# Patient Record
Sex: Female | Born: 1985 | Race: White | Hispanic: No | Marital: Married | Smoking: Never smoker
Health system: Southern US, Community
[De-identification: ages and names within clinical notes are randomized; demographics above are authoritative.]

## PROBLEM LIST (undated history)

## (undated) ENCOUNTER — Inpatient Hospital Stay (HOSPITAL_COMMUNITY): Payer: Self-pay

## (undated) DIAGNOSIS — F319 Bipolar disorder, unspecified: Secondary | ICD-10-CM

## (undated) DIAGNOSIS — R87629 Unspecified abnormal cytological findings in specimens from vagina: Secondary | ICD-10-CM

## (undated) DIAGNOSIS — Z9109 Other allergy status, other than to drugs and biological substances: Secondary | ICD-10-CM

## (undated) DIAGNOSIS — K529 Noninfective gastroenteritis and colitis, unspecified: Secondary | ICD-10-CM

## (undated) DIAGNOSIS — J45909 Unspecified asthma, uncomplicated: Secondary | ICD-10-CM

## (undated) DIAGNOSIS — D696 Thrombocytopenia, unspecified: Secondary | ICD-10-CM

## (undated) DIAGNOSIS — O99119 Other diseases of the blood and blood-forming organs and certain disorders involving the immune mechanism complicating pregnancy, unspecified trimester: Secondary | ICD-10-CM

## (undated) DIAGNOSIS — Z8619 Personal history of other infectious and parasitic diseases: Secondary | ICD-10-CM

## (undated) HISTORY — DX: Unspecified abnormal cytological findings in specimens from vagina: R87.629

## (undated) HISTORY — PX: TONSILLECTOMY: SUR1361

## (undated) HISTORY — PX: MYRINGOTOMY WITH TUBE PLACEMENT: SHX5663

## (undated) HISTORY — DX: Personal history of other infectious and parasitic diseases: Z86.19

## (undated) HISTORY — PX: GYNECOLOGIC CRYOSURGERY: SHX857

## (undated) HISTORY — DX: Other diseases of the blood and blood-forming organs and certain disorders involving the immune mechanism complicating pregnancy, unspecified trimester: O99.119

## (undated) HISTORY — PX: WISDOM TOOTH EXTRACTION: SHX21

## (undated) HISTORY — DX: Other diseases of the blood and blood-forming organs and certain disorders involving the immune mechanism complicating pregnancy, unspecified trimester: D69.6

## (undated) HISTORY — PX: SINUS EXPLORATION: SHX5214

---

## 2014-10-30 LAB — OB RESULTS CONSOLE ABO/RH: RH TYPE: NEGATIVE

## 2014-10-30 LAB — OB RESULTS CONSOLE RUBELLA ANTIBODY, IGM: Rubella: IMMUNE

## 2014-10-30 LAB — OB RESULTS CONSOLE GC/CHLAMYDIA
CHLAMYDIA, DNA PROBE: NEGATIVE
Gonorrhea: NEGATIVE

## 2014-10-30 LAB — OB RESULTS CONSOLE HIV ANTIBODY (ROUTINE TESTING): HIV: NONREACTIVE

## 2014-10-30 LAB — OB RESULTS CONSOLE ANTIBODY SCREEN: Antibody Screen: NEGATIVE

## 2014-10-30 LAB — OB RESULTS CONSOLE RPR: RPR: NONREACTIVE

## 2014-10-30 LAB — OB RESULTS CONSOLE HEPATITIS B SURFACE ANTIGEN: Hepatitis B Surface Ag: NEGATIVE

## 2015-01-12 ENCOUNTER — Encounter (HOSPITAL_COMMUNITY): Payer: Self-pay | Admitting: *Deleted

## 2015-01-12 ENCOUNTER — Inpatient Hospital Stay (HOSPITAL_COMMUNITY)
Admission: AD | Admit: 2015-01-12 | Discharge: 2015-01-13 | Disposition: A | Discharge status: Home / Self Care | Payer: BLUE CROSS/BLUE SHIELD | Source: Ambulatory Visit | Attending: Obstetrics and Gynecology | Admitting: Obstetrics and Gynecology

## 2015-01-12 DIAGNOSIS — K529 Noninfective gastroenteritis and colitis, unspecified: Secondary | ICD-10-CM | POA: Diagnosis present

## 2015-01-12 DIAGNOSIS — Z3A21 21 weeks gestation of pregnancy: Secondary | ICD-10-CM | POA: Insufficient documentation

## 2015-01-12 DIAGNOSIS — O210 Mild hyperemesis gravidarum: Secondary | ICD-10-CM | POA: Insufficient documentation

## 2015-01-12 DIAGNOSIS — F319 Bipolar disorder, unspecified: Secondary | ICD-10-CM | POA: Insufficient documentation

## 2015-01-12 DIAGNOSIS — R197 Diarrhea, unspecified: Secondary | ICD-10-CM | POA: Insufficient documentation

## 2015-01-12 DIAGNOSIS — O99112 Other diseases of the blood and blood-forming organs and certain disorders involving the immune mechanism complicating pregnancy, second trimester: Secondary | ICD-10-CM | POA: Insufficient documentation

## 2015-01-12 DIAGNOSIS — D696 Thrombocytopenia, unspecified: Secondary | ICD-10-CM | POA: Insufficient documentation

## 2015-01-12 DIAGNOSIS — J45909 Unspecified asthma, uncomplicated: Secondary | ICD-10-CM | POA: Insufficient documentation

## 2015-01-12 HISTORY — DX: Unspecified asthma, uncomplicated: J45.909

## 2015-01-12 HISTORY — DX: Noninfective gastroenteritis and colitis, unspecified: K52.9

## 2015-01-12 HISTORY — DX: Bipolar disorder, unspecified: F31.9

## 2015-01-12 HISTORY — DX: Other allergy status, other than to drugs and biological substances: Z91.09

## 2015-01-12 NOTE — MAU Note (Signed)
N/v/d since this am. Husband sick with same symptoms

## 2015-01-13 ENCOUNTER — Encounter (HOSPITAL_COMMUNITY): Payer: Self-pay | Admitting: *Deleted

## 2015-01-13 DIAGNOSIS — O99112 Other diseases of the blood and blood-forming organs and certain disorders involving the immune mechanism complicating pregnancy, second trimester: Secondary | ICD-10-CM | POA: Diagnosis not present

## 2015-01-13 DIAGNOSIS — O210 Mild hyperemesis gravidarum: Secondary | ICD-10-CM | POA: Diagnosis not present

## 2015-01-13 DIAGNOSIS — K529 Noninfective gastroenteritis and colitis, unspecified: Secondary | ICD-10-CM

## 2015-01-13 DIAGNOSIS — Z3A21 21 weeks gestation of pregnancy: Secondary | ICD-10-CM | POA: Diagnosis not present

## 2015-01-13 DIAGNOSIS — R197 Diarrhea, unspecified: Secondary | ICD-10-CM | POA: Diagnosis not present

## 2015-01-13 DIAGNOSIS — F319 Bipolar disorder, unspecified: Secondary | ICD-10-CM | POA: Diagnosis not present

## 2015-01-13 DIAGNOSIS — D696 Thrombocytopenia, unspecified: Secondary | ICD-10-CM | POA: Diagnosis not present

## 2015-01-13 DIAGNOSIS — J45909 Unspecified asthma, uncomplicated: Secondary | ICD-10-CM | POA: Diagnosis not present

## 2015-01-13 HISTORY — DX: Noninfective gastroenteritis and colitis, unspecified: K52.9

## 2015-01-13 LAB — URINALYSIS, ROUTINE W REFLEX MICROSCOPIC
Glucose, UA: NEGATIVE mg/dL
HGB URINE DIPSTICK: NEGATIVE
Ketones, ur: 40 mg/dL — AB
Leukocytes, UA: NEGATIVE
Nitrite: NEGATIVE
Protein, ur: NEGATIVE mg/dL
UROBILINOGEN UA: 0.2 mg/dL (ref 0.0–1.0)
pH: 6 (ref 5.0–8.0)

## 2015-01-13 LAB — URINALYSIS, DIPSTICK ONLY
Bilirubin Urine: NEGATIVE
GLUCOSE, UA: 250 mg/dL — AB
Hgb urine dipstick: NEGATIVE
Ketones, ur: NEGATIVE mg/dL
Leukocytes, UA: NEGATIVE
NITRITE: NEGATIVE
Protein, ur: NEGATIVE mg/dL
UROBILINOGEN UA: 0.2 mg/dL (ref 0.0–1.0)
pH: 5.5 (ref 5.0–8.0)

## 2015-01-13 LAB — CBC
HCT: 37.1 % (ref 36.0–46.0)
HEMOGLOBIN: 12.8 g/dL (ref 12.0–15.0)
MCH: 30 pg (ref 26.0–34.0)
MCHC: 34.5 g/dL (ref 30.0–36.0)
MCV: 86.9 fL (ref 78.0–100.0)
Platelets: 137 10*3/uL — ABNORMAL LOW (ref 150–400)
RBC: 4.27 MIL/uL (ref 3.87–5.11)
RDW: 13.3 % (ref 11.5–15.5)
WBC: 14.5 10*3/uL — AB (ref 4.0–10.5)

## 2015-01-13 LAB — COMPREHENSIVE METABOLIC PANEL
ALK PHOS: 82 U/L (ref 38–126)
ALT: 18 U/L (ref 14–54)
AST: 19 U/L (ref 15–41)
Albumin: 3.4 g/dL — ABNORMAL LOW (ref 3.5–5.0)
Anion gap: 9 (ref 5–15)
BILIRUBIN TOTAL: 0.3 mg/dL (ref 0.3–1.2)
BUN: 8 mg/dL (ref 6–20)
CALCIUM: 8.5 mg/dL — AB (ref 8.9–10.3)
CHLORIDE: 102 mmol/L (ref 101–111)
CO2: 24 mmol/L (ref 22–32)
Creatinine, Ser: 0.62 mg/dL (ref 0.44–1.00)
GFR calc non Af Amer: >60 mL/min (ref >60)
GLUCOSE: 114 mg/dL — AB (ref 65–99)
Potassium: 3.6 mmol/L (ref 3.5–5.1)
Sodium: 135 mmol/L (ref 135–145)
Total Protein: 6.3 g/dL — ABNORMAL LOW (ref 6.5–8.1)

## 2015-01-13 LAB — AMYLASE: AMYLASE: 55 U/L (ref 28–100)

## 2015-01-13 LAB — LIPASE, BLOOD: LIPASE: 19 U/L — AB (ref 22–51)

## 2015-01-13 MED ORDER — PROMETHAZINE HCL 12.5 MG PO TABS: 12.5000 mg | ORAL_TABLET | Freq: Four times a day (QID) | ORAL | Status: DC | PRN

## 2015-01-13 MED ORDER — M.V.I. ADULT IV INJ
Freq: Once | INTRAVENOUS | Status: DC
  Filled 2015-01-13: qty 1000

## 2015-01-13 MED ORDER — M.V.I. ADULT IV INJ: Freq: Once | INTRAVENOUS | Status: DC

## 2015-01-13 MED ORDER — PROMETHAZINE HCL 25 MG/ML IJ SOLN
INTRAVENOUS | Status: AC
  Administered 2015-01-13: 01:00:00 via INTRAVENOUS
  Filled 2015-01-13: qty 1000

## 2015-01-13 MED ORDER — M.V.I. ADULT IV INJ
Freq: Once | INTRAVENOUS | Status: AC
  Administered 2015-01-13: 02:00:00 via INTRAVENOUS
  Filled 2015-01-13: qty 1000

## 2015-01-13 NOTE — MAU Provider Note (Signed)
History     CSN: [id number]  Arrival date and time: 01/12/15 2343  Provider notified: 0042 Orders placed in EPIC: 0053 Provider on unit: 0240 Provider at bedside: 0245     Chief Complaint  Patient presents with  . Emesis  . Diarrhea   HPI  Ms. Sarah Ramirez is 29 yo G1P0 female at 21.[redacted] wks gestation presenting with complaints of N/V/D all day.  She reports that she has been unable to keep any food or fluids down all day.  She was prescribed Zofran 8 mg ODT, took one dose and had no relief; still having projectile vomiting and diarrhea.  Her husband is sick at home with the same symptoms. She reports (+) flutters. Denies VB or LOF.  Her OB hx is significant for: bipolar disorder on no meds (stopped with (+) UPT). Dr. Ernestina PennaFogleman is her primary OB provider at [facility].   Past Medical History  Diagnosis Date  . Asthma   . Environmental allergies   . Bipolar disorder   . Gastroenteritis 01/13/2015    Past Surgical History  Procedure Laterality Date  . Sinus exploration    . Tonsillectomy    . Myringotomy with tube placement      History reviewed. No pertinent family history.  History  Substance Use Topics  . Smoking status: Never Smoker   . Smokeless tobacco: Not on file  . Alcohol Use: No    Allergies:  Allergies  Allergen Reactions  . Peanut-Containing Drug Products   . Shellfish Allergy   . Tomato   . Sulfa Antibiotics Nausea Only  . Zoloft [Sertraline Hcl] Nausea Only    No prescriptions prior to admission    Review of Systems  Constitutional: Positive for chills and malaise/fatigue.  HENT: Negative.   Eyes: Negative.   Cardiovascular: Negative.   Gastrointestinal: Positive for nausea, vomiting and diarrhea.       Vomiting x 2 episodes since arriving to hospital  Genitourinary: Negative.   Musculoskeletal: Negative.   Skin: Negative.   Neurological: Positive for dizziness.       Some leg twitching since receiving Phenergan  Endo/Heme/Allergies: Negative.    Psychiatric/Behavioral: Negative.    Results for orders placed or performed during the hospital encounter of 01/12/15 (from the past 24 hour(s))  Urinalysis, Routine w reflex microscopic     Status: Abnormal   Collection Time: 01/13/15 12:55 AM  Result Value Ref Range   Color, Urine YELLOW YELLOW   APPearance CLEAR CLEAR   Specific Gravity, Urine >1.030 (H) 1.005 - 1.030   pH 6.0 5.0 - 8.0   Glucose, UA NEGATIVE NEGATIVE mg/dL   Hgb urine dipstick NEGATIVE NEGATIVE   Bilirubin Urine SMALL (A) NEGATIVE   Ketones, ur 40 (A) NEGATIVE mg/dL   Protein, ur NEGATIVE NEGATIVE mg/dL   Urobilinogen, UA 0.2 0.0 - 1.0 mg/dL   Nitrite NEGATIVE NEGATIVE   Leukocytes, UA NEGATIVE NEGATIVE  CBC     Status: Abnormal   Collection Time: 01/13/15  1:00 AM  Result Value Ref Range   WBC 14.5 (H) 4.0 - 10.5 K/uL   RBC 4.27 3.87 - 5.11 MIL/uL   Hemoglobin 12.8 12.0 - 15.0 g/dL   HCT 40.937.1 81.136.0 - 91.446.0 %   MCV 86.9 78.0 - 100.0 fL   MCH 30.0 26.0 - 34.0 pg   MCHC 34.5 30.0 - 36.0 g/dL   RDW 78.213.3 95.611.5 - 21.315.5 %   Platelets 137 (L) 150 - 400 K/uL  Comprehensive metabolic panel  Status: Abnormal   Collection Time: 01/13/15  1:00 AM  Result Value Ref Range   Sodium 135 135 - 145 mmol/L   Potassium 3.6 3.5 - 5.1 mmol/L   Chloride 102 101 - 111 mmol/L   CO2 24 22 - 32 mmol/L   Glucose, Bld 114 (H) 65 - 99 mg/dL   BUN 8 6 - 20 mg/dL   Creatinine, Ser 5.28 0.44 - 1.00 mg/dL   Calcium 8.5 (L) 8.9 - 10.3 mg/dL   Total Protein 6.3 (L) 6.5 - 8.1 g/dL   Albumin 3.4 (L) 3.5 - 5.0 g/dL   AST 19 15 - 41 U/L   ALT 18 14 - 54 U/L   Alkaline Phosphatase 82 38 - 126 U/L   Total Bilirubin 0.3 0.3 - 1.2 mg/dL   GFR calc non Af Amer >60 >60 mL/min   GFR calc Af Amer >60 >60 mL/min   Anion gap 9 5 - 15  Amylase     Status: None   Collection Time: 01/13/15  1:00 AM  Result Value Ref Range   Amylase 55 28 - 100 U/L  Lipase, blood     Status: Abnormal   Collection Time: 01/13/15  1:00 AM  Result Value Ref  Range   Lipase 19 (L) 22 - 51 U/L  Urinalysis, dipstick only     Status: Abnormal   Collection Time: 01/13/15  3:30 AM  Result Value Ref Range   Specific Gravity, Urine <1.005 (L) 1.005 - 1.030   pH 5.5 5.0 - 8.0   Glucose, UA 250 (A) NEGATIVE mg/dL   Hgb urine dipstick NEGATIVE NEGATIVE   Bilirubin Urine NEGATIVE NEGATIVE   Ketones, ur NEGATIVE NEGATIVE mg/dL   Protein, ur NEGATIVE NEGATIVE mg/dL   Urobilinogen, UA 0.2 0.0 - 1.0 mg/dL   Nitrite NEGATIVE NEGATIVE   Leukocytes, UA NEGATIVE NEGATIVE   FHTs (doppler): 148 bpm  Physical Exam   Blood pressure 124/64, pulse 87, temperature 98 F (36.7 C), resp. rate 20, height  (1.702 m), weight 89.177 kg (196 lb 9.6 oz).  Physical Exam  Constitutional: She is oriented to person, place, and time. She appears well-developed and well-nourished.  HENT:  Head: Normocephalic and atraumatic.  Neck: Normal range of motion.  Cardiovascular: Normal rate, regular rhythm and normal heart sounds.   Respiratory: Effort normal and breath sounds normal.  GI: Soft.  Hypoactive bowel sounds  Genitourinary:  Pelvic exam deferred; (+) flutters  Musculoskeletal: Normal range of motion.  Neurological: She is alert and oriented to person, place, and time.  Skin: Skin is warm and dry.  Psychiatric: She has a normal mood and affect. Her behavior is normal. Judgment and thought content normal.    MAU Course  Procedures CCUA CBC CMP Amylase  Lipase LR with Phenergan 25 mg IV bolus D5LR with MVI at 500 ml/hr Urine dipstick only prior to end of 2nd liter of fluid Assessment and Plan  29 yo G1P0 at 21.[redacted] wks gestation Gastroenteritis Gestational Thrombocytopenia  Discharge Home Rx for Phenergan 12.5 mg po tablet every 4 hours prn N/V Alternate Zofran 8 mg ODT every 8 hours prn N/V Clear liquids as tolerated advancing to B.R.A.T. Diet today Keep scheduled appointment in 3 wks with Dr. Ernestina Penna Call the office for any problems, questions  or concerns   Kenard Gower MSN, CNM 01/13/2015, 3:00 AM

## 2015-01-13 NOTE — Discharge Instructions (Signed)
Viral Gastroenteritis Viral gastroenteritis is also known as stomach flu. This condition affects the stomach and intestinal tract. It can cause sudden diarrhea and vomiting. The illness typically lasts 3 to 8 days. Most people develop an immune response that eventually gets rid of the virus. While this natural response develops, the virus can make you quite ill. CAUSES  Many different viruses can cause gastroenteritis, such as rotavirus or noroviruses. You can catch one of these viruses by consuming contaminated food or water. You may also catch a virus by sharing utensils or other personal items with an infected person or by touching a contaminated surface. SYMPTOMS  The most common symptoms are diarrhea and vomiting. These problems can cause a severe loss of body fluids (dehydration) and a body salt (electrolyte) imbalance. Other symptoms may include:  Fever.  Headache.  Fatigue.  Abdominal pain. DIAGNOSIS  Your caregiver can usually diagnose viral gastroenteritis based on your symptoms and a physical exam. A stool sample may also be taken to test for the presence of viruses or other infections. TREATMENT  This illness typically goes away on its own. Treatments are aimed at rehydration. The most serious cases of viral gastroenteritis involve vomiting so severely that you are not able to keep fluids down. In these cases, fluids must be given through an intravenous line (IV). HOME CARE INSTRUCTIONS   Drink enough fluids to keep your urine clear or pale yellow. Drink small amounts of fluids frequently and increase the amounts as tolerated.  Ask your caregiver for specific rehydration instructions.  Avoid:  Foods high in sugar.  Alcohol.  Carbonated drinks.  Tobacco.  Juice.  Caffeine drinks.  Extremely hot or cold fluids.  Fatty, greasy foods.  Too much intake of anything at one time.  Dairy products until 24 to 48 hours after diarrhea stops.  You may consume  probiotics. Probiotics are active cultures of beneficial bacteria. They may lessen the amount and number of diarrheal stools in adults. Probiotics can be found in yogurt with active cultures and in supplements.  Wash your hands well to avoid spreading the virus.  Only take over-the-counter or prescription medicines for pain, discomfort, or fever as directed by your caregiver. Do not give aspirin to children. Antidiarrheal medicines are not recommended.  Ask your caregiver if you should continue to take your regular prescribed and over-the-counter medicines.  Keep all follow-up appointments as directed by your caregiver. SEEK IMMEDIATE MEDICAL CARE IF:   You are unable to keep fluids down.  You do not urinate at least once every 6 to 8 hours.  You develop shortness of breath.  You notice blood in your stool or vomit. This may look like coffee grounds.  You have abdominal pain that increases or is concentrated in one small area (localized).  You have persistent vomiting or diarrhea.  You have a fever.  The patient is a child younger than 3 months, and he or she has a fever.  The patient is a child older than 3 months, and he or she has a fever and persistent symptoms.  The patient is a child older than 3 months, and he or she has a fever and symptoms suddenly get worse.  The patient is a baby, and he or she has no tears when crying. MAKE SURE YOU:   Understand these instructions.  Will watch your condition.  Will get help right away if you are not doing well or get worse. Document Released: 08/10/2005 Document Revised: 11/02/2011 Document Reviewed: 05/27/2011  ExitCare Patient Information 2015 [place], [place]. This information is not intended to replace advice given to you by your health care provider. Make sure you discuss any questions you have with your health care provider.  Clear Liquid Diet A clear liquid diet is a short-term diet that is prescribed to provide the  necessary fluid and basic energy you need when you can have nothing else. The clear liquid diet consists of liquids or solids that will become liquid at room temperature. You should be able to see through the liquid. There are many reasons that you may be restricted to clear liquids, such as:  When you have a sudden-onset (acute) condition that occurs before or after surgery.  To help your body slowly get adjusted to food again after a long period when you were unable to have food.  Replacement of fluids when you have a diarrheal disease.  When you are going to have certain exams, such as a colonoscopy, in which instruments are inserted inside your body to look at parts of your digestive system. WHAT CAN I HAVE? A clear liquid diet does not provide all the nutrients you need. It is important to choose a variety of the following items to get as many nutrients as possible:  Vegetable juices that do not have pulp.  Fruit juices and fruit drinks that do not have pulp.  Coffee (regular or decaffeinated), tea, or soda at the discretion of your health care provider.  Clear bouillon, broth, or strained broth-based soups.  High-protein and flavored gelatins.  Sugar or honey.  Ices or frozen ice pops that do not contain milk. If you are not sure whether you can have certain items, you should ask your health care provider. You may also ask your health care provider if there are any other clear liquid options. Document Released: 08/10/2005 Document Revised: 08/15/2013 Document Reviewed: 07/07/2013 [facility] Patient Information 2015 [place], [place]. This information is not intended to replace advice given to you by your health care provider. Make sure you discuss any questions you have with your health care provider.  Food Choices to Help Relieve Diarrhea When you have diarrhea, the foods you eat and your eating habits are very important. Choosing the right foods and drinks can help relieve diarrhea.  Also, because diarrhea can last up to 7 days, you need to replace lost fluids and electrolytes (such as sodium, potassium, and chloride) in order to help prevent dehydration.  WHAT GENERAL GUIDELINES DO I NEED TO FOLLOW?  Slowly drink 1 cup (8 oz) of fluid for each episode of diarrhea. If you are getting enough fluid, your urine will be clear or pale yellow.  Eat starchy foods. Some good choices include white rice, white toast, pasta, low-fiber cereal, baked potatoes (without the skin), saltine crackers, and bagels.  Avoid large servings of any cooked vegetables.  Limit fruit to two servings per day. A serving is  cup or 1 small piece.  Choose foods with less than 2 g of fiber per serving.  Limit fats to less than 8 tsp (38 g) per day.  Avoid fried foods.  Eat foods that have probiotics in them. Probiotics can be found in certain dairy products.  Avoid foods and beverages that may increase the speed at which food moves through the stomach and intestines (gastrointestinal tract). Things to avoid include:  High-fiber foods, such as dried fruit, raw fruits and vegetables, nuts, seeds, and whole grain foods.  Spicy foods and high-fat foods.  Foods and beverages  sweetened with high-fructose corn syrup, honey, or sugar alcohols such as xylitol, sorbitol, and mannitol. WHAT FOODS ARE RECOMMENDED? Grains White rice. White, [place], or pita breads (fresh or toasted), including plain rolls, buns, or bagels. White pasta. Saltine, soda, or graham crackers. Pretzels. Low-fiber cereal. Cooked cereals made with water (such as cornmeal, farina, or cream cereals). Plain muffins. Matzo. Melba toast. Zwieback.  Vegetables Potatoes (without the skin). Strained tomato and vegetable juices. Most well-cooked and canned vegetables without seeds. Tender lettuce. Fruits Cooked or canned applesauce, apricots, cherries, fruit cocktail, grapefruit, peaches, pears, or plums. Fresh bananas, apples without skin,  cherries, grapes, cantaloupe, grapefruit, peaches, oranges, or plums.  Meat and Other Protein Products Baked or boiled chicken. Eggs. Tofu. Fish. Seafood. Smooth peanut butter. Ground or well-cooked tender beef, ham, veal, lamb, pork, or poultry.  Dairy Plain yogurt, kefir, and unsweetened liquid yogurt. Lactose-free milk, buttermilk, or soy milk. Plain hard cheese. Beverages Sport drinks. Clear broths. Diluted fruit juices (except prune). Regular, caffeine-free sodas such as ginger ale. Water. Decaffeinated teas. Oral rehydration solutions. Sugar-free beverages not sweetened with sugar alcohols. Other Bouillon, broth, or soups made from recommended foods.  The items listed above may not be a complete list of recommended foods or beverages. Contact your dietitian for more options. WHAT FOODS ARE NOT RECOMMENDED? Grains Whole grain, whole wheat, bran, or rye breads, rolls, pastas, crackers, and cereals. Wild or brown rice. Cereals that contain more than 2 g of fiber per serving. Corn tortillas or taco shells. Cooked or dry oatmeal. Granola. Popcorn. Vegetables Raw vegetables. Cabbage, broccoli, Brussels sprouts, artichokes, baked beans, beet greens, corn, kale, legumes, peas, sweet potatoes, and yams. Potato skins. Cooked spinach and cabbage. Fruits Dried fruit, including raisins and dates. Raw fruits. Stewed or dried prunes. Fresh apples with skin, apricots, mangoes, pears, raspberries, and strawberries.  Meat and Other Protein Products Chunky peanut butter. Nuts and seeds. Beans and lentils. Tomasa Blase.  Dairy High-fat cheeses. Milk, chocolate milk, and beverages made with milk, such as milk shakes. Cream. Ice cream. Sweets and Desserts Sweet rolls, doughnuts, and sweet breads. Pancakes and waffles. Fats and Oils Butter. Cream sauces. Margarine. Salad oils. Plain salad dressings. Olives. Avocados.  Beverages Caffeinated beverages (such as coffee, tea, soda, or energy drinks). Alcoholic  beverages. Fruit juices with pulp. Prune juice. Soft drinks sweetened with high-fructose corn syrup or sugar alcohols. Other Coconut. Hot sauce. Chili powder. Mayonnaise. Gravy. Cream-based or milk-based soups.  The items listed above may not be a complete list of foods and beverages to avoid. Contact your dietitian for more information. WHAT SHOULD I DO IF I BECOME DEHYDRATED? Diarrhea can sometimes lead to dehydration. Signs of dehydration include dark urine and dry mouth and skin. If you think you are dehydrated, you should rehydrate with an oral rehydration solution. These solutions can be purchased at pharmacies, retail stores, or online.  Drink -1 cup (120-240 mL) of oral rehydration solution each time you have an episode of diarrhea. If drinking this amount makes your diarrhea worse, try drinking smaller amounts more often. For example, drink 1-3 tsp (5-15 mL) every 5-10 minutes.  A general rule for staying hydrated is to drink 1-2 L of fluid per day. Talk to your health care provider about the specific amount you should be drinking each day. Drink enough fluids to keep your urine clear or pale yellow. Document Released: 10/31/2003 Document Revised: 08/15/2013 Document Reviewed: 07/03/2013 [facility] Patient Information 2015 [place], [place]. This information is not intended to replace advice given to  you by your health care provider. Make sure you discuss any questions you have with your health care provider.

## 2015-05-07 LAB — OB RESULTS CONSOLE GBS: STREP GROUP B AG: NEGATIVE

## 2015-05-23 ENCOUNTER — Other Ambulatory Visit: Payer: Self-pay | Admitting: Obstetrics

## 2015-05-23 ENCOUNTER — Inpatient Hospital Stay (HOSPITAL_COMMUNITY)
Admission: AD | Admit: 2015-05-23 | Discharge: 2015-05-23 | Disposition: A | Discharge status: Home / Self Care | Payer: BLUE CROSS/BLUE SHIELD | Source: Ambulatory Visit | Attending: Obstetrics and Gynecology | Admitting: Obstetrics and Gynecology

## 2015-05-23 ENCOUNTER — Encounter (HOSPITAL_COMMUNITY): Payer: Self-pay | Admitting: *Deleted

## 2015-05-23 DIAGNOSIS — Z3493 Encounter for supervision of normal pregnancy, unspecified, third trimester: Secondary | ICD-10-CM | POA: Diagnosis not present

## 2015-05-23 NOTE — MAU Note (Signed)
Pt states that she began to contract regularly tonight. Pt denies LOF and bleeding and states that baby is active.

## 2015-05-23 NOTE — MAU Note (Signed)
Pt to be d/c to home when strip is reactive.

## 2015-05-23 NOTE — MAU Note (Signed)
Paged

## 2015-05-23 NOTE — Discharge Instructions (Signed)
Braxton Hicks Contractions °Contractions of the uterus can occur throughout pregnancy. Contractions are not always a sign that you are in labor.  °WHAT ARE BRAXTON HICKS CONTRACTIONS?  °Contractions that occur before labor are called Braxton Hicks contractions, or false labor. Toward the end of pregnancy (32-34 weeks), these contractions can develop more often and may become more forceful. This is not true labor because these contractions do not result in opening (dilatation) and thinning of the cervix. They are sometimes difficult to tell apart from true labor because these contractions can be forceful and people have different pain tolerances. You should not feel embarrassed if you go to the hospital with false labor. Sometimes, the only way to tell if you are in true labor is for your health care provider to look for changes in the cervix. °If there are no prenatal problems or other health problems associated with the pregnancy, it is completely safe to be sent home with false labor and await the onset of true labor. °HOW CAN YOU TELL THE DIFFERENCE BETWEEN TRUE AND FALSE LABOR? °False Labor °· The contractions of false labor are usually shorter and not as hard as those of true labor.   °· The contractions are usually irregular.   °· The contractions are often felt in the front of the lower abdomen and in the groin.   °· The contractions may go away when you walk around or change positions while lying down.   °· The contractions get weaker and are shorter lasting as time goes on.   °· The contractions do not usually become progressively stronger, regular, and closer together as with true labor.   °True Labor °· Contractions in true labor last 30-70 seconds, become very regular, usually become more intense, and increase in frequency.   °· The contractions do not go away with walking.   °· The discomfort is usually felt in the top of the uterus and spreads to the lower abdomen and low back.   °· True labor can be  determined by your health care provider with an exam. This will show that the cervix is dilating and getting thinner.   °WHAT TO REMEMBER °· Keep up with your usual exercises and follow other instructions given by your health care provider.   °· Take medicines as directed by your health care provider.   °· Keep your regular prenatal appointments.   °· Eat and drink lightly if you think you are going into labor.   °· If Braxton Hicks contractions are making you uncomfortable:   °¨ Change your position from lying down or resting to walking, or from walking to resting.   °¨ Sit and rest in a tub of warm water.   °¨ Drink 2-3 glasses of water. Dehydration may cause these contractions.   °¨ Do slow and deep breathing several times an hour.   °WHEN SHOULD I SEEK IMMEDIATE MEDICAL CARE? °Seek immediate medical care if: °· Your contractions become stronger, more regular, and closer together.   °· You have fluid leaking or gushing from your vagina.   °· You have a fever.   °· You pass blood-tinged mucus.   °· You have vaginal bleeding.   °· You have continuous abdominal pain.   °· You have low back pain that you never had before.   °· You feel your baby's head pushing down and causing pelvic pressure.   °· Your baby is not moving as much as it used to.   °Document Released: 08/10/2005 Document Revised: 08/15/2013 Document Reviewed: 05/22/2013 °ExitCare® Patient Information ©2015 [employer], [employer]. This information is not intended to replace advice given to you by your health care   provider. Make sure you discuss any questions you have with your health care provider. ° °

## 2015-05-29 ENCOUNTER — Telehealth (HOSPITAL_COMMUNITY): Payer: Self-pay | Admitting: *Deleted

## 2015-05-29 ENCOUNTER — Encounter (HOSPITAL_COMMUNITY): Payer: Self-pay | Admitting: *Deleted

## 2015-05-29 NOTE — Telephone Encounter (Signed)
Preadmission screen  

## 2015-05-30 ENCOUNTER — Encounter (HOSPITAL_COMMUNITY): Payer: Self-pay

## 2015-05-30 ENCOUNTER — Inpatient Hospital Stay (HOSPITAL_COMMUNITY)
Admission: RE | Admit: 2015-05-30 | Discharge: 2015-06-02 | DRG: 765 | Disposition: A | Discharge status: Home / Self Care | Payer: BLUE CROSS/BLUE SHIELD | Source: Ambulatory Visit | Attending: Obstetrics | Admitting: Obstetrics

## 2015-05-30 ENCOUNTER — Inpatient Hospital Stay (HOSPITAL_COMMUNITY): Payer: BLUE CROSS/BLUE SHIELD | Admitting: Anesthesiology

## 2015-05-30 DIAGNOSIS — D509 Iron deficiency anemia, unspecified: Secondary | ICD-10-CM | POA: Diagnosis present

## 2015-05-30 DIAGNOSIS — Z6834 Body mass index (BMI) 34.0-34.9, adult: Secondary | ICD-10-CM | POA: Diagnosis not present

## 2015-05-30 DIAGNOSIS — F319 Bipolar disorder, unspecified: Secondary | ICD-10-CM | POA: Diagnosis present

## 2015-05-30 DIAGNOSIS — E669 Obesity, unspecified: Secondary | ICD-10-CM | POA: Diagnosis present

## 2015-05-30 DIAGNOSIS — O9902 Anemia complicating childbirth: Secondary | ICD-10-CM | POA: Diagnosis present

## 2015-05-30 DIAGNOSIS — Z3A41 41 weeks gestation of pregnancy: Secondary | ICD-10-CM

## 2015-05-30 DIAGNOSIS — O480 Post-term pregnancy: Secondary | ICD-10-CM | POA: Diagnosis present

## 2015-05-30 DIAGNOSIS — O99344 Other mental disorders complicating childbirth: Secondary | ICD-10-CM | POA: Diagnosis present

## 2015-05-30 DIAGNOSIS — O99214 Obesity complicating childbirth: Secondary | ICD-10-CM | POA: Diagnosis present

## 2015-05-30 DIAGNOSIS — D696 Thrombocytopenia, unspecified: Secondary | ICD-10-CM | POA: Diagnosis present

## 2015-05-30 DIAGNOSIS — O9952 Diseases of the respiratory system complicating childbirth: Secondary | ICD-10-CM | POA: Diagnosis present

## 2015-05-30 DIAGNOSIS — D62 Acute posthemorrhagic anemia: Secondary | ICD-10-CM | POA: Diagnosis not present

## 2015-05-30 DIAGNOSIS — J45909 Unspecified asthma, uncomplicated: Secondary | ICD-10-CM | POA: Diagnosis present

## 2015-05-30 DIAGNOSIS — O9962 Diseases of the digestive system complicating childbirth: Secondary | ICD-10-CM | POA: Diagnosis present

## 2015-05-30 DIAGNOSIS — K219 Gastro-esophageal reflux disease without esophagitis: Secondary | ICD-10-CM | POA: Diagnosis present

## 2015-05-30 LAB — COMPREHENSIVE METABOLIC PANEL
ALK PHOS: 145 U/L — AB (ref 38–126)
ALT: 11 U/L — ABNORMAL LOW (ref 14–54)
ANION GAP: 4 — AB (ref 5–15)
AST: 15 U/L (ref 15–41)
Albumin: 2.8 g/dL — ABNORMAL LOW (ref 3.5–5.0)
BILIRUBIN TOTAL: 0.3 mg/dL (ref 0.3–1.2)
BUN: 8 mg/dL (ref 6–20)
CALCIUM: 8.2 mg/dL — AB (ref 8.9–10.3)
CO2: 21 mmol/L — ABNORMAL LOW (ref 22–32)
Chloride: 111 mmol/L (ref 101–111)
Creatinine, Ser: 0.66 mg/dL (ref 0.44–1.00)
GFR calc non Af Amer: >60 mL/min (ref >60)
Glucose, Bld: 92 mg/dL (ref 65–99)
Potassium: 3.7 mmol/L (ref 3.5–5.1)
Sodium: 136 mmol/L (ref 135–145)
TOTAL PROTEIN: 6.1 g/dL — AB (ref 6.5–8.1)

## 2015-05-30 LAB — CBC
HCT: 33.1 % — ABNORMAL LOW (ref 36.0–46.0)
HCT: 32.6 % — ABNORMAL LOW (ref 36.0–46.0)
HEMOGLOBIN: 11 g/dL — AB (ref 12.0–15.0)
Hemoglobin: 10.8 g/dL — ABNORMAL LOW (ref 12.0–15.0)
MCH: 29.3 pg (ref 26.0–34.0)
MCH: 29.3 pg (ref 26.0–34.0)
MCHC: 33.2 g/dL (ref 30.0–36.0)
MCHC: 33.1 g/dL (ref 30.0–36.0)
MCV: 88 fL (ref 78.0–100.0)
MCV: 88.3 fL (ref 78.0–100.0)
PLATELETS: 122 10*3/uL — AB (ref 150–400)
Platelets: 133 10*3/uL — ABNORMAL LOW (ref 150–400)
RBC: 3.76 MIL/uL — ABNORMAL LOW (ref 3.87–5.11)
RBC: 3.69 MIL/uL — ABNORMAL LOW (ref 3.87–5.11)
RDW: 14.5 % (ref 11.5–15.5)
RDW: 14.4 % (ref 11.5–15.5)
WBC: 10.9 10*3/uL — AB (ref 4.0–10.5)
WBC: 13 10*3/uL — AB (ref 4.0–10.5)

## 2015-05-30 LAB — ABO/RH: ABO/RH(D): A NEG

## 2015-05-30 LAB — TYPE AND SCREEN
ABO/RH(D): A NEG
ANTIBODY SCREEN: NEGATIVE

## 2015-05-30 LAB — URIC ACID: URIC ACID, SERUM: 4.4 mg/dL (ref 2.3–6.6)

## 2015-05-30 LAB — RPR: RPR Ser Ql: NONREACTIVE

## 2015-05-30 MED ORDER — TERBUTALINE SULFATE 1 MG/ML IJ SOLN: 0.2500 mg | Freq: Once | INTRAMUSCULAR | Status: DC | PRN

## 2015-05-30 MED ORDER — ONDANSETRON HCL 4 MG/2ML IJ SOLN
4.0000 mg | Freq: Four times a day (QID) | INTRAMUSCULAR | Status: DC | PRN
  Administered 2015-05-30: 4 mg via INTRAVENOUS
  Filled 2015-05-30: qty 2

## 2015-05-30 MED ORDER — EPHEDRINE 5 MG/ML INJ: 10.0000 mg | INTRAVENOUS | Status: DC | PRN

## 2015-05-30 MED ORDER — OXYCODONE-ACETAMINOPHEN 5-325 MG PO TABS: 2.0000 | ORAL_TABLET | ORAL | Status: DC | PRN

## 2015-05-30 MED ORDER — PHENYLEPHRINE 40 MCG/ML (10ML) SYRINGE FOR IV PUSH (FOR BLOOD PRESSURE SUPPORT)
80.0000 ug | PREFILLED_SYRINGE | INTRAVENOUS | Status: DC | PRN
  Filled 2015-05-30: qty 20

## 2015-05-30 MED ORDER — CITRIC ACID-SODIUM CITRATE 334-500 MG/5ML PO SOLN
30.0000 mL | ORAL | Status: DC | PRN
  Administered 2015-05-31: 30 mL via ORAL
  Filled 2015-05-30 (×2): qty 15

## 2015-05-30 MED ORDER — LACTATED RINGERS IV SOLN: 500.0000 mL | INTRAVENOUS | Status: DC | PRN

## 2015-05-30 MED ORDER — OXYTOCIN BOLUS FROM INFUSION: 500.0000 mL | INTRAVENOUS | Status: DC

## 2015-05-30 MED ORDER — OXYCODONE-ACETAMINOPHEN 5-325 MG PO TABS: 1.0000 | ORAL_TABLET | ORAL | Status: DC | PRN

## 2015-05-30 MED ORDER — ACETAMINOPHEN 325 MG PO TABS: 650.0000 mg | ORAL_TABLET | ORAL | Status: DC | PRN

## 2015-05-30 MED ORDER — LIDOCAINE HCL (PF) 1 % IJ SOLN
30.0000 mL | INTRAMUSCULAR | Status: DC | PRN
  Filled 2015-05-30: qty 30

## 2015-05-30 MED ORDER — MISOPROSTOL 25 MCG QUARTER TABLET
25.0000 ug | ORAL_TABLET | ORAL | Status: AC | PRN
  Administered 2015-05-30 (×2): 25 ug via VAGINAL
  Filled 2015-05-30 (×2): qty 0.25

## 2015-05-30 MED ORDER — FENTANYL 2.5 MCG/ML BUPIVACAINE 1/10 % EPIDURAL INFUSION (WH - ANES)
14.0000 mL/h | INTRAMUSCULAR | Status: DC | PRN
  Administered 2015-05-30 – 2015-05-31 (×2): 14 mL/h via EPIDURAL
  Filled 2015-05-30 (×2): qty 125

## 2015-05-30 MED ORDER — BUTORPHANOL TARTRATE 1 MG/ML IJ SOLN
1.0000 mg | INTRAMUSCULAR | Status: DC | PRN
  Administered 2015-05-30: 1 mg via INTRAVENOUS
  Filled 2015-05-30: qty 1

## 2015-05-30 MED ORDER — LIDOCAINE HCL (PF) 1 % IJ SOLN
INTRAMUSCULAR | Status: DC | PRN
  Administered 2015-05-30: 2 mL via EPIDURAL
  Administered 2015-05-30 (×2): 4 mL via EPIDURAL

## 2015-05-30 MED ORDER — OXYTOCIN 40 UNITS IN LACTATED RINGERS INFUSION - SIMPLE MED
1.0000 m[IU]/min | INTRAVENOUS | Status: DC
  Administered 2015-05-30: 2 m[IU]/min via INTRAVENOUS
  Administered 2015-05-30: 6 m[IU]/min via INTRAVENOUS
  Filled 2015-05-30: qty 1000

## 2015-05-30 MED ORDER — OXYTOCIN 40 UNITS IN LACTATED RINGERS INFUSION - SIMPLE MED: 62.5000 mL/h | INTRAVENOUS | Status: DC

## 2015-05-30 MED ORDER — DIPHENHYDRAMINE HCL 50 MG/ML IJ SOLN: 12.5000 mg | INTRAMUSCULAR | Status: DC | PRN

## 2015-05-30 MED ORDER — LACTATED RINGERS IV SOLN
INTRAVENOUS | Status: DC
  Administered 2015-05-30 – 2015-05-31 (×7): via INTRAVENOUS

## 2015-05-30 NOTE — Progress Notes (Signed)
S: Doing well, no complaints, pain well controlled with epidural. Cervical foley recently out  O: BP 126/71 mmHg  Pulse 82  Temp(Src) 98.2 F (36.8 C) (Oral)  Resp 18  Ht  (1.676 m)  Wt 96.616 kg (213 lb)  BMI 34.40 kg/m2  SpO2 96%   FHT:  FHR: 120s bpm, variability: moderate,  accelerations:  Present,  decelerations:  Absent UC:   regular, every 2 minutes, pit at 6 munits/ min SVE:   Dilation: 5 Effacement (%): 90 Station: -2 Exam by:: Shaheer Bonfield   A / P:  29 y.o.  Obstetric History   G1   P0   T0   P0   A0   TAB0   SAB0   E0   M0   L0    at [redacted]w[redacted]d  IOL, post-dates, s/p cytotec x 2, cervical foley, will begin to titrate pitocin to MVU as IUPC just placed  Fetal Wellbeing:  Category I Pain Control:  Epidural  Anticipated MOD:  NSVD  Sarah Ramirez A. 05/30/2015, 9:18 PM

## 2015-05-30 NOTE — Progress Notes (Signed)
S: Doing well, no complaints, pain well controlled, planning epidural  O: BP 125/71 mmHg  Pulse 89  Temp(Src) 97.6 F (36.4 C) (Oral)  Resp 20  Ht  (1.676 m)  Wt 96.616 kg (213 lb)  BMI 34.40 kg/m2   FHT:  FHR: 120s bpm, variability: moderate,  accelerations:  Present,  decelerations:  Absent UC:   irritibility SVE:   Dilation: 1 Effacement (%): 70 Station: -1 Exam by:: k Zillah Alexie   A / P:  29 y.o.  Obstetric History   G1   P0   T0   P0   A0   TAB0   SAB0   E0   M0   L0    at [redacted]w[redacted]d post dates IOL, s/p cervical ripening with cytotec x 2, foley bulb placed, start pitocin  Fetal Wellbeing:  Category I Pain Control:  Epidural planned  Anticipated MOD:  NSVD  Efstathios Sawin A. 05/30/2015, 5:05 PM

## 2015-05-30 NOTE — Anesthesia Procedure Notes (Signed)
Epidural Patient location during procedure: OB  Staffing Anesthesiologist: Nayellie Sanseverino Performed by: anesthesiologist   Preanesthetic Checklist Completed: patient identified, site marked, surgical consent, pre-op evaluation, timeout performed, IV checked, risks and benefits discussed and monitors and equipment checked  Epidural Patient position: sitting Prep: site prepped and draped and DuraPrep Patient monitoring: continuous pulse ox and blood pressure Approach: midline Location: L3-L4 Injection technique: LOR saline  Needle:  Needle type: Tuohy  Needle gauge: 17 G Needle length: 9 cm and 9 Needle insertion depth: 6 cm Catheter type: closed end flexible Catheter size: 19 Gauge Catheter at skin depth: 10 cm Test dose: negative  Assessment Events: blood not aspirated, injection not painful, no injection resistance, negative IV test and no paresthesia  Additional Notes Patient identified. Risks/Benefits/Options discussed with patient including but not limited to bleeding, infection, nerve damage, paralysis, failed block, incomplete pain control, headache, blood pressure changes, nausea, vomiting, reactions to medications, itching and postpartum back pain. Confirmed with bedside nurse the patient's most recent platelet count. Confirmed with patient that they are not currently taking any anticoagulation, have any bleeding history or any family history of bleeding disorders. Patient expressed understanding and wished to proceed. All questions were answered. Sterile technique was used throughout the entire procedure. Please see nursing notes for vital signs. Test dose was given through epidural catheter and negative prior to continuing to dose epidural or start infusion. Warning signs of high block given to the patient including shortness of breath, tingling/numbness in hands, complete motor block, or any concerning symptoms with instructions to call for help. Patient was given  instructions on fall risk and not to get out of bed. All questions and concerns addressed with instructions to call with any issues or inadequate analgesia.      

## 2015-05-30 NOTE — Anesthesia Preprocedure Evaluation (Addendum)
Anesthesia Evaluation  Patient identified by MRN, date of birth, ID band Patient awake    Reviewed: Allergy & Precautions, H&P , NPO status , Patient's Chart, lab work & pertinent test results  Airway Mallampati: II  TM Distance: >3 FB Neck ROM: full    Dental no notable dental hx. (+) Teeth Intact   Pulmonary asthma ,    Pulmonary exam normal breath sounds clear to auscultation       Cardiovascular negative cardio ROS Normal cardiovascular exam Rhythm:regular Rate:Normal     Neuro/Psych PSYCHIATRIC DISORDERS Bipolar Disorder negative neurological ROS     GI/Hepatic Neg liver ROS, GERD  Medicated and Controlled,  Endo/Other  Obesity  Renal/GU negative Renal ROS  negative genitourinary   Musculoskeletal negative musculoskeletal ROS (+)   Abdominal (+) + obese,   Peds  Hematology negative hematology ROS (+)   Anesthesia Other Findings   Reproductive/Obstetrics (+) Pregnancy                           Anesthesia Physical Anesthesia Plan  ASA: II and emergent  Anesthesia Plan: Epidural   Post-op Pain Management:    Induction:   Airway Management Planned: Natural Airway  Additional Equipment:   Intra-op Plan:   Post-operative Plan:   Informed Consent: I have reviewed the patients History and Physical, chart, labs and discussed the procedure including the risks, benefits and alternatives for the proposed anesthesia with the patient or authorized representative who has indicated his/her understanding and acceptance.     Plan Discussed with: Anesthesiologist, CRNA and Surgeon  Anesthesia Plan Comments: (Patient for C/section for failure to progress. Will use epidural for C/Section. M. Jamari Diana,MD)       Anesthesia Quick Evaluation

## 2015-05-30 NOTE — H&P (Signed)
Sarah Ramirez is a 29 y.o. G1P0 at [redacted]w[redacted]d presenting for cervical ripening prior to IOL. Pt notes rare contractions . Good fetal movement, No vaginal bleeding, not leaking fluid.  [facility] at [facility] since 8 wks - Dated by 8 wk u/s, unsure LMP - unplanned but desired preg - bipolar d/o, stopped meds with learning of pregnancy, early exposure to antiepileptic, Klonopin and Celexa, off all meds once learning of preg and mood overall stable - low lying placenta, resolved at 28 wks  - normal growth at 37 wks   Prenatal Transfer Tool  Maternal Diabetes: No Genetic Screening: Normal Maternal Ultrasounds/Referrals: Normal Fetal Ultrasounds or other Referrals:  None Maternal Substance Abuse:  No Significant Maternal Medications:  None Significant Maternal Lab Results: None     OB History    Gravida Para Term Preterm AB TAB SAB Ectopic Multiple Living   1              Past Medical History  Diagnosis Date  . Asthma   . Environmental allergies   . Bipolar disorder (HCC)   . Gastroenteritis 01/13/2015  . Vaginal Pap smear, abnormal   . Hx of varicella   . Thrombocytopenia complicating pregnancy [facility])    Past Surgical History  Procedure Laterality Date  . Sinus exploration    . Tonsillectomy    . Myringotomy with tube placement    . Gynecologic cryosurgery    . Wisdom tooth extraction     Family History: family history includes Cancer in her father, mother, and paternal grandfather; Diabetes in her maternal grandmother; Heart disease in her mother; Hypertension in her father and paternal grandfather. Social History:  reports that she has never smoked. She does not have any smokeless tobacco history on file. She reports that she does not drink alcohol or use illicit drugs.  Review of Systems - Negative except discomfort of pregnancy   Dilation: 1 Effacement (%): 70 Station: -1 Exam by:: h stone rnc Blood pressure 152/67, pulse 87, temperature 98 F (36.7 C), temperature  source Oral, resp. rate 16, height  (1.676 m), weight 96.616 kg (213 lb).  Physical Exam:  Gen: well appearing, no distress Back: no CVAT Abd: gravid, NT, no RUQ pain LE: trace edema, equal bilaterally, non-tender Toco: irritibility FH: baseline 140s, accelerations present, no deceleratons, 10 beat variability  Prenatal labs: ABO, Rh: --/--/A NEG (10/06 0853) Antibody: NEG (10/06 0853) Rubella:  immune RPR: Nonreactive (03/08 0000)  HBsAg: Negative (03/08 0000)  HIV: Non-reactive (03/08 0000)  GBS: Negative (09/13 0000)  1 hr Glucola 87  Genetic screening nl  NT, nl AFP Anatomy US normal   Assessment/Plan: 29 y.o. G1P0 at [redacted]w[redacted]d Post-dated IOL, start with cervical ripening, plan cytotec x 2 then pitocin GBS neg Reactive fetal testing   Sarah Ramirez A. 05/30/2015, 1:20 PM

## 2015-05-31 ENCOUNTER — Encounter (HOSPITAL_COMMUNITY)
Admission: RE | Disposition: A | Discharge status: Home / Self Care | Payer: Self-pay | Source: Ambulatory Visit | Attending: Obstetrics

## 2015-05-31 ENCOUNTER — Encounter (HOSPITAL_COMMUNITY): Payer: Self-pay

## 2015-05-31 DIAGNOSIS — O480 Post-term pregnancy: Secondary | ICD-10-CM | POA: Diagnosis present

## 2015-05-31 LAB — CBC
HCT: 30 % — ABNORMAL LOW (ref 36.0–46.0)
Hemoglobin: 10 g/dL — ABNORMAL LOW (ref 12.0–15.0)
MCH: 29.2 pg (ref 26.0–34.0)
MCHC: 33.3 g/dL (ref 30.0–36.0)
MCV: 87.7 fL (ref 78.0–100.0)
PLATELETS: 140 10*3/uL — AB (ref 150–400)
RBC: 3.42 MIL/uL — AB (ref 3.87–5.11)
RDW: 14.3 % (ref 11.5–15.5)
WBC: 21.2 10*3/uL — AB (ref 4.0–10.5)

## 2015-05-31 SURGERY — Surgical Case
Anesthesia: Epidural

## 2015-05-31 MED ORDER — MEPERIDINE HCL 25 MG/ML IJ SOLN
6.2500 mg | INTRAMUSCULAR | Status: DC | PRN
Start: 2015-05-31 — End: 2015-06-01

## 2015-05-31 MED ORDER — IBUPROFEN 600 MG PO TABS
600.0000 mg | ORAL_TABLET | Freq: Four times a day (QID) | ORAL | Status: DC
  Administered 2015-05-31 – 2015-06-02 (×8): 600 mg via ORAL
  Filled 2015-05-31 (×9): qty 1

## 2015-05-31 MED ORDER — OXYTOCIN 10 UNIT/ML IJ SOLN
40.0000 [IU] | INTRAMUSCULAR | Status: DC | PRN
  Administered 2015-05-31: 40 [IU] via INTRAVENOUS

## 2015-05-31 MED ORDER — SCOPOLAMINE 1 MG/3DAYS TD PT72: 1.0000 | MEDICATED_PATCH | Freq: Once | TRANSDERMAL | Status: DC

## 2015-05-31 MED ORDER — TETANUS-DIPHTH-ACELL PERTUSSIS 5-2.5-18.5 LF-MCG/0.5 IM SUSP: 0.5000 mL | Freq: Once | INTRAMUSCULAR | Status: DC

## 2015-05-31 MED ORDER — MEPERIDINE HCL 25 MG/ML IJ SOLN: 6.2500 mg | INTRAMUSCULAR | Status: DC | PRN

## 2015-05-31 MED ORDER — ZOLPIDEM TARTRATE 5 MG PO TABS: 5.0000 mg | ORAL_TABLET | Freq: Every evening | ORAL | Status: DC | PRN

## 2015-05-31 MED ORDER — CEFAZOLIN SODIUM-DEXTROSE 2-3 GM-% IV SOLR
INTRAVENOUS | Status: DC | PRN
  Administered 2015-05-31: 2 g via INTRAVENOUS

## 2015-05-31 MED ORDER — LIDOCAINE-EPINEPHRINE (PF) 2 %-1:200000 IJ SOLN
INTRAMUSCULAR | Status: AC
  Filled 2015-05-31: qty 20

## 2015-05-31 MED ORDER — FENTANYL CITRATE (PF) 100 MCG/2ML IJ SOLN: 25.0000 ug | INTRAMUSCULAR | Status: DC | PRN

## 2015-05-31 MED ORDER — MENTHOL 3 MG MT LOZG: 1.0000 | LOZENGE | OROMUCOSAL | Status: DC | PRN

## 2015-05-31 MED ORDER — NALOXONE HCL 0.4 MG/ML IJ SOLN: 0.4000 mg | INTRAMUSCULAR | Status: DC | PRN

## 2015-05-31 MED ORDER — SODIUM CHLORIDE 0.9 % IJ SOLN
3.0000 mL | INTRAMUSCULAR | Status: DC | PRN
Start: 2015-05-31 — End: 2015-06-01

## 2015-05-31 MED ORDER — KETOROLAC TROMETHAMINE 30 MG/ML IJ SOLN: 30.0000 mg | Freq: Four times a day (QID) | INTRAMUSCULAR | Status: DC | PRN

## 2015-05-31 MED ORDER — ONDANSETRON HCL 4 MG/2ML IJ SOLN: 4.0000 mg | Freq: Three times a day (TID) | INTRAMUSCULAR | Status: DC | PRN

## 2015-05-31 MED ORDER — MORPHINE SULFATE (PF) 0.5 MG/ML IJ SOLN
INTRAMUSCULAR | Status: AC
  Filled 2015-05-31: qty 100

## 2015-05-31 MED ORDER — NALBUPHINE HCL 10 MG/ML IJ SOLN: 5.0000 mg | INTRAMUSCULAR | Status: DC | PRN

## 2015-05-31 MED ORDER — DIPHENHYDRAMINE HCL 25 MG PO CAPS: 25.0000 mg | ORAL_CAPSULE | ORAL | Status: DC | PRN

## 2015-05-31 MED ORDER — FENTANYL CITRATE (PF) 100 MCG/2ML IJ SOLN
INTRAMUSCULAR | Status: DC | PRN
  Administered 2015-05-31: 100 ug via INTRAVENOUS

## 2015-05-31 MED ORDER — OXYCODONE-ACETAMINOPHEN 5-325 MG PO TABS
2.0000 | ORAL_TABLET | ORAL | Status: DC | PRN
  Administered 2015-06-02: 2 via ORAL
  Filled 2015-05-31 (×2): qty 2

## 2015-05-31 MED ORDER — ONDANSETRON HCL 4 MG/2ML IJ SOLN
INTRAMUSCULAR | Status: DC | PRN
  Administered 2015-05-31: 4 mg via INTRAVENOUS

## 2015-05-31 MED ORDER — ONDANSETRON HCL 4 MG/2ML IJ SOLN
INTRAMUSCULAR | Status: AC
  Filled 2015-05-31: qty 2

## 2015-05-31 MED ORDER — SODIUM BICARBONATE 8.4 % IV SOLN
INTRAVENOUS | Status: DC | PRN
  Administered 2015-05-31 (×4): 5 mL via EPIDURAL

## 2015-05-31 MED ORDER — SODIUM CHLORIDE 0.9 % IJ SOLN: 3.0000 mL | INTRAMUSCULAR | Status: DC | PRN

## 2015-05-31 MED ORDER — NALOXONE HCL 1 MG/ML IJ SOLN: 1.0000 ug/kg/h | INTRAVENOUS | Status: DC | PRN

## 2015-05-31 MED ORDER — OXYCODONE-ACETAMINOPHEN 5-325 MG PO TABS
1.0000 | ORAL_TABLET | ORAL | Status: DC | PRN
  Administered 2015-05-31 – 2015-06-02 (×7): 1 via ORAL
  Filled 2015-05-31 (×7): qty 1

## 2015-05-31 MED ORDER — ACETAMINOPHEN 325 MG PO TABS
650.0000 mg | ORAL_TABLET | ORAL | Status: DC | PRN
  Administered 2015-05-31: 650 mg via ORAL
  Filled 2015-05-31: qty 2

## 2015-05-31 MED ORDER — TERBUTALINE SULFATE 1 MG/ML IJ SOLN: 0.2500 mg | Freq: Once | INTRAMUSCULAR | Status: DC

## 2015-05-31 MED ORDER — SIMETHICONE 80 MG PO CHEW
80.0000 mg | CHEWABLE_TABLET | ORAL | Status: DC
  Administered 2015-06-01 – 2015-06-02 (×2): 80 mg via ORAL
  Filled 2015-05-31 (×2): qty 1

## 2015-05-31 MED ORDER — DIPHENHYDRAMINE HCL 25 MG PO CAPS: 25.0000 mg | ORAL_CAPSULE | Freq: Four times a day (QID) | ORAL | Status: DC | PRN

## 2015-05-31 MED ORDER — OXYTOCIN 40 UNITS IN LACTATED RINGERS INFUSION - SIMPLE MED: 62.5000 mL/h | INTRAVENOUS | Status: DC

## 2015-05-31 MED ORDER — DIPHENHYDRAMINE HCL 50 MG/ML IJ SOLN: 12.5000 mg | INTRAMUSCULAR | Status: DC | PRN

## 2015-05-31 MED ORDER — SCOPOLAMINE 1 MG/3DAYS TD PT72
MEDICATED_PATCH | TRANSDERMAL | Status: DC | PRN
  Administered 2015-05-31: 1 via TRANSDERMAL

## 2015-05-31 MED ORDER — WITCH HAZEL-GLYCERIN EX PADS: 1.0000 "application " | MEDICATED_PAD | CUTANEOUS | Status: DC | PRN

## 2015-05-31 MED ORDER — SIMETHICONE 80 MG PO CHEW: 80.0000 mg | CHEWABLE_TABLET | ORAL | Status: DC | PRN

## 2015-05-31 MED ORDER — FENTANYL CITRATE (PF) 100 MCG/2ML IJ SOLN
INTRAMUSCULAR | Status: AC
  Filled 2015-05-31: qty 4

## 2015-05-31 MED ORDER — CEFAZOLIN SODIUM-DEXTROSE 2-3 GM-% IV SOLR
INTRAVENOUS | Status: AC
  Filled 2015-05-31: qty 50

## 2015-05-31 MED ORDER — IBUPROFEN 600 MG PO TABS: 600.0000 mg | ORAL_TABLET | Freq: Four times a day (QID) | ORAL | Status: DC | PRN

## 2015-05-31 MED ORDER — NALBUPHINE HCL 10 MG/ML IJ SOLN: 5.0000 mg | Freq: Once | INTRAMUSCULAR | Status: DC | PRN

## 2015-05-31 MED ORDER — LANOLIN HYDROUS EX OINT: 1.0000 "application " | TOPICAL_OINTMENT | CUTANEOUS | Status: DC | PRN

## 2015-05-31 MED ORDER — SIMETHICONE 80 MG PO CHEW
80.0000 mg | CHEWABLE_TABLET | Freq: Three times a day (TID) | ORAL | Status: DC
  Administered 2015-05-31 – 2015-06-02 (×5): 80 mg via ORAL
  Filled 2015-05-31 (×5): qty 1

## 2015-05-31 MED ORDER — KETOROLAC TROMETHAMINE 30 MG/ML IJ SOLN
30.0000 mg | Freq: Four times a day (QID) | INTRAMUSCULAR | Status: DC | PRN
Start: 2015-05-31 — End: 2015-05-31

## 2015-05-31 MED ORDER — SCOPOLAMINE 1 MG/3DAYS TD PT72
1.0000 | MEDICATED_PATCH | Freq: Once | TRANSDERMAL | Status: DC
  Filled 2015-05-31: qty 1

## 2015-05-31 MED ORDER — NALOXONE HCL 1 MG/ML IJ SOLN
1.0000 ug/kg/h | INTRAVENOUS | Status: DC | PRN
  Filled 2015-05-31: qty 2

## 2015-05-31 MED ORDER — MORPHINE SULFATE (PF) 0.5 MG/ML IJ SOLN
INTRAMUSCULAR | Status: DC | PRN
  Administered 2015-05-31: 1 mg via INTRAVENOUS
  Administered 2015-05-31: 4 mg via EPIDURAL

## 2015-05-31 MED ORDER — LACTATED RINGERS IV SOLN
INTRAVENOUS | Status: DC
  Administered 2015-05-31: 15:00:00 via INTRAVENOUS

## 2015-05-31 MED ORDER — DIBUCAINE 1 % RE OINT: 1.0000 "application " | TOPICAL_OINTMENT | RECTAL | Status: DC | PRN

## 2015-05-31 MED ORDER — SENNOSIDES-DOCUSATE SODIUM 8.6-50 MG PO TABS
2.0000 | ORAL_TABLET | ORAL | Status: DC
  Administered 2015-06-01 – 2015-06-02 (×2): 2 via ORAL
  Filled 2015-05-31 (×2): qty 2

## 2015-05-31 MED ORDER — SCOPOLAMINE 1 MG/3DAYS TD PT72
MEDICATED_PATCH | TRANSDERMAL | Status: AC
  Filled 2015-05-31: qty 1

## 2015-05-31 MED ORDER — OXYTOCIN 10 UNIT/ML IJ SOLN
INTRAMUSCULAR | Status: AC
  Filled 2015-05-31: qty 4

## 2015-05-31 MED ORDER — PRENATAL MULTIVITAMIN CH
1.0000 | ORAL_TABLET | Freq: Every day | ORAL | Status: DC
Start: 2015-05-31 — End: 2015-06-02
  Administered 2015-05-31 – 2015-06-02 (×3): 1 via ORAL
  Filled 2015-05-31 (×3): qty 1

## 2015-05-31 SURGICAL SUPPLY — 33 items
BENZOIN TINCTURE PRP APPL 2/3 (GAUZE/BANDAGES/DRESSINGS) ×2 IMPLANT
CLAMP CORD UMBIL (MISCELLANEOUS) IMPLANT
CLOTH BEACON ORANGE TIMEOUT ST (SAFETY) ×2 IMPLANT
CONTAINER PREFILL 10% NBF 15ML (MISCELLANEOUS) IMPLANT
DRAPE SHEET LG 3/4 BI-LAMINATE (DRAPES) IMPLANT
DRSG OPSITE POSTOP 4X10 (GAUZE/BANDAGES/DRESSINGS) ×2 IMPLANT
DURAPREP 26ML APPLICATOR (WOUND CARE) ×2 IMPLANT
ELECT REM PT RETURN 9FT ADLT (ELECTROSURGICAL) ×2
ELECTRODE REM PT RTRN 9FT ADLT (ELECTROSURGICAL) ×1 IMPLANT
EXTRACTOR VACUUM M CUP 4 TUBE (SUCTIONS) IMPLANT
GLOVE BIO SURGEON STRL SZ 6.5 (GLOVE) ×2 IMPLANT
GLOVE BIOGEL PI IND STRL 7.0 (GLOVE) ×1 IMPLANT
GLOVE BIOGEL PI INDICATOR 7.0 (GLOVE) ×1
GOWN STRL REUS W/TWL LRG LVL3 (GOWN DISPOSABLE) ×4 IMPLANT
KIT ABG SYR 3ML LUER SLIP (SYRINGE) IMPLANT
NEEDLE HYPO 22GX1.5 SAFETY (NEEDLE) IMPLANT
NEEDLE HYPO 25X5/8 SAFETYGLIDE (NEEDLE) IMPLANT
NS IRRIG 1000ML POUR BTL (IV SOLUTION) ×2 IMPLANT
PACK C SECTION WH (CUSTOM PROCEDURE TRAY) ×2 IMPLANT
PAD OB MATERNITY 4.3X12.25 (PERSONAL CARE ITEMS) ×2 IMPLANT
PENCIL SMOKE EVAC W/HOLSTER (ELECTROSURGICAL) ×2 IMPLANT
STRIP CLOSURE SKIN 1/2X4 (GAUZE/BANDAGES/DRESSINGS) ×2 IMPLANT
SUT MON AB 4-0 PS1 27 (SUTURE) ×4 IMPLANT
SUT PLAIN 0 NONE (SUTURE) IMPLANT
SUT PLAIN 2 0 XLH (SUTURE) ×2 IMPLANT
SUT VIC AB 0 CT1 36 (SUTURE) ×6 IMPLANT
SUT VIC AB 0 CTX 36 (SUTURE) ×1
SUT VIC AB 0 CTX36XBRD ANBCTRL (SUTURE) ×1 IMPLANT
SUT VIC AB 2-0 CT1 27 (SUTURE) ×1
SUT VIC AB 2-0 CT1 TAPERPNT 27 (SUTURE) ×1 IMPLANT
SYR CONTROL 10ML LL (SYRINGE) IMPLANT
TOWEL OR 17X24 6PK STRL BLUE (TOWEL DISPOSABLE) ×2 IMPLANT
TRAY FOLEY CATH SILVER 14FR (SET/KITS/TRAYS/PACK) IMPLANT

## 2015-05-31 NOTE — Progress Notes (Signed)
S: Doing well, no complaints, pain well controlled with epidural  O: BP 124/69 mmHg  Pulse 91  Temp(Src) 98.2 F (36.8 C) (Oral)  Resp 18  Ht  (1.676 m)  Wt 96.616 kg (213 lb)  BMI 34.40 kg/m2  SpO2 96%   FHT:  FHR: 130s bpm, variability: moderate,  accelerations:  Present,  decelerations:  Absent UC:   regular, every 2 minutes SVE:   Dilation: 8 Effacement (%): 100 Station: 0 Exam by:: JDaley   A / P:  29 y.o.  Obstetric History   G1   P0   T0   P0   A0   TAB0   SAB0   E0   M0   L0    at [redacted]w[redacted]d post dates IOL, good progress, now in active labor  Fetal Wellbeing:  Category I Pain Control:  Epidural  Anticipated MOD:  NSVD  Sarah Ramirez A. 05/31/2015, 1:54 AM

## 2015-05-31 NOTE — Anesthesia Postprocedure Evaluation (Signed)
  Anesthesia Post-op Note  Patient: Sarah Ramirez  Procedure(s) Performed: Procedure(s) (LRB): CESAREAN SECTION (N/A)  Patient Location: PACU  Anesthesia Type: Epidural  Level of Consciousness: awake and alert   Airway and Oxygen Therapy: Patient Spontanous Breathing  Post-op Pain: mild  Post-op Assessment: Post-op Vital signs reviewed, Patient's Cardiovascular Status Stable, Respiratory Function Stable, Patent Airway and No signs of Nausea or vomiting Moving BLE Last Vitals:  Filed Vitals:   05/31/15 0815  BP: 112/63  Pulse: 97  Temp:   Resp: 19    Post-op Vital Signs: stable   Complications: No apparent anesthesia complications

## 2015-05-31 NOTE — Addendum Note (Signed)
Addendum  created 05/31/15 1406 by Shanon Payor, CRNA   Modules edited: Notes Section   Notes Section:  File: [id number]

## 2015-05-31 NOTE — Lactation Note (Signed)
This note was copied from the chart of Sarah Tierre Gerard. Lactation Consultation Note  Patient Name: Sarah Ramirez [id number] Date: 05/31/2015 Reason for consult: Initial assessment Mom had baby latched when [facility] arrived. Baby demonstrating good suckling bursts with swallowing motions noted. Basic teaching reviewed with parents. Lactation brochure left for review, advised of OP services and support group. Encouraged to call for assist as needed.   Maternal Data Does the patient have breastfeeding experience prior to this delivery?: No  Feeding Feeding Type: Breast Fed  LATCH Score/Interventions                      Lactation Tools Discussed/Used     Consult Status Consult Status: Follow-up Date: 05/31/15 Follow-up type: In-patient    Alfred Levins 05/31/2015, 3:01 PM

## 2015-05-31 NOTE — Brief Op Note (Signed)
05/30/2015 - 05/31/2015  7:20 AM  PATIENT:  Sarah Ramirez  29 y.o. female  PRE-OPERATIVE DIAGNOSIS:  Cesarean section for arrest of descent  POST-OPERATIVE DIAGNOSIS:  Cesarean section for arrest of descent  PROCEDURE:  Procedure(s): CESAREAN SECTION (N/A)  SURGEON:  Surgeon(s) and Role:    * Noland Fordyce, MD - Primary  PHYSICIAN ASSISTANT:   ASSISTANTS: Fredric Mare, CNM   ANESTHESIA:   epidural  EBL:  Total I/O In: 300 [I.V.:300] Out: -   BLOOD ADMINISTERED:none  DRAINS: Urinary Catheter (Foley)   LOCAL MEDICATIONS USED:  NONE  SPECIMEN:  Source of Specimen:  placenta  DISPOSITION OF SPECIMEN:  L&D  COUNTS:  YES  TOURNIQUET:  * No tourniquets in log *  DICTATION: .Note written in EPIC  PLAN OF CARE: Admit to inpatient   PATIENT DISPOSITION:  PACU - hemodynamically stable.   Delay start of Pharmacological VTE agent (>24hrs) due to surgical blood loss or risk of bleeding: yes

## 2015-05-31 NOTE — Op Note (Signed)
05/30/2015 - 05/31/2015  7:20 AM  PATIENT:  Sarah Ramirez  28 y.o. female  PRE-OPERATIVE DIAGNOSIS:  Cesarean section for arrest of descent  POST-OPERATIVE DIAGNOSIS:  Cesarean section for arrest of descent  PROCEDURE:  Procedure(s): CESAREAN SECTION (N/A)  SURGEON:  Surgeon(s) and Role:    * Sarah Fordyce, MD - Primary  PHYSICIAN ASSISTANT:   ASSISTANTS: Sarah Ramirez, CNM   ANESTHESIA:   epidural  EBL:  Total I/O In: 300 [I.V.:300] Out: -   BLOOD ADMINISTERED:none  DRAINS: Urinary Catheter (Foley)   LOCAL MEDICATIONS USED:  NONE  SPECIMEN:  Source of Specimen:  placenta  DISPOSITION OF SPECIMEN:  L&D  COUNTS:  YES  TOURNIQUET:  * No tourniquets in log *  DICTATION: .Note written in EPIC  PLAN OF CARE: Admit to inpatient   PATIENT DISPOSITION:  PACU - hemodynamically stable.   Delay start of Pharmacological VTE agent (>24hrs) due to surgical blood loss or risk of bleeding: yes   Findings:  @ infant,  APGAR (1 MIN): 9   APGAR (5 MINS): 9   APGAR (10 MINS):   Normal uterus, tubes and ovaries, normal placenta. 3VC, clear amniotic fluid  EBL: 700 cc Antibiotics:   2g Ancef Complications: none  Indications: This is a 29 y.o. year-old, G1  At [redacted]w[redacted]d admitted for postdates IOL. Pt had cytotec x 2 then cervical foley and pitocin. Once in active labor she progressed well. After 1 hr pushing pt progressed to +2 but over the next hour no change in descent. Options of continued pushing vs PCS d/w pt who requested PCS.  Risks benefits and alternatives of the procedure were discussed with the patient who agreed to proceed  Procedure:  After informed consent was obtained the patient was taken to the operating room where epidural anesthesia was found to be adequat.  She was prepped and draped in the normal sterile fashion in low lithotomy in yellow fin stir-ups with a leftward tilt.  A foley catheter was in place.  A Pfannenstiel skin incision was made 2 cm above the  pubic symphysis in the midline with the scalpel.  Dissection was carried down with the Bovie cautery until the fascia was reached. The fascia was incised in the midline. The incision was extended laterally with the Mayo scissors. The inferior aspect of the fascial incision was grasped with the Coker clamps, elevated up and the underlying rectus muscles were dissected off sharply. The superior aspect of the fascial incision was grasped with the Coker clamps elevated up and the underlying rectus muscles were dissected off sharply.  The peritoneum was entered bluntly. The peritoneal incision was extended superiorly and inferiorly with good visualization of the bladder. The bladder blade was inserted and palpation was done to assess the fetal position and the location of the uterine vessels. Fetal shoulders were presenting at the lower uterine segment. The L&D nurse placed a hand vaginally to elevate the fetal head.  The lower segment of the uterus was incised sharply with the scalpel and extended  bluntly in the cephalo-caudal fashion. The infant was grasped, brought to the incision,  rotated and the infant was delivered with fundal pressure. The nose and mouth were bulb suctioned. The cord was clamped and cut. The infant was handed off to the waiting pediatrician. The placenta was expressed. The uterus was exteriorized. The uterus was cleared of all clots and debris. A small right lower extension was noted and repaired primarily.  The uterine incision was repaired with 0 Vicryl in  a running locked fashion.  Of noted, the uterine tissue in the lower segment was edematous. A second layer of the same suture was used in an imbricating fashion to obtain excellent hemostasis. 2 additional sutures placed in the midpoint of the incision.  The uterus was then returned to the abdomen, the gutters were cleared of all clots and debris. The uterine incision was reinspected and found to be hemostatic. The peritoneum was grasped and  closed with 2-0 Vicryl in a running fashion. The cut muscle edges and the underside of the fascia were inspected and found to be hemostatic. The fascia was closed with 0 Vicryl in two halves. The subcutaneous tissue was irrigated. Scarpa's layer was closed with a 2-0 plain gut suture. The skin was closed with a 4-0 Monocryl in a single layer. The patient tolerated the procedure well. Sponge lap and needle counts were correct x3 and patient was taken to the recovery room in a stable condition.  Sarah Ramirez A. 05/31/2015 7:21 AM

## 2015-05-31 NOTE — Transfer of Care (Signed)
Immediate Anesthesia Transfer of Care Note  Patient: Sarah Ramirez  Procedure(s) Performed: Procedure(s): CESAREAN SECTION (N/A)  Patient Location: PACU  Anesthesia Type:Epidural  Level of Consciousness: awake and alert   Airway & Oxygen Therapy: Patient Spontanous Breathing  Post-op Assessment: Report given to RN and Post -op Vital signs reviewed and stable  Post vital signs: Reviewed and stable  Last Vitals:  Filed Vitals:   05/31/15 0545  BP: 127/66  Pulse: 101  Temp:   Resp:     Complications: No apparent anesthesia complications

## 2015-05-31 NOTE — Anesthesia Postprocedure Evaluation (Signed)
  Anesthesia Post-op Note  Patient: Sarah Ramirez  Procedure(s) Performed: Procedure(s): CESAREAN SECTION (N/A)  Patient Location: Mother/Baby  Anesthesia Type:Epidural  Level of Consciousness: awake, alert  and oriented  Airway and Oxygen Therapy: Patient Spontanous Breathing  Post-op Pain: none  Post-op Assessment: Post-op Vital signs reviewed, Patient's Cardiovascular Status Stable, Respiratory Function Stable, Patent Airway, Adequate PO intake, Pain level controlled, No headache, No backache and Patient able to bend at knees      Post-op Vital Signs: Reviewed and stable  Last Vitals:  Filed Vitals:   05/31/15 1230  BP: 119/59  Pulse: 93  Temp:   Resp: 18    Complications: No apparent anesthesia complications

## 2015-06-01 LAB — CBC
HEMATOCRIT: 25.5 % — AB (ref 36.0–46.0)
Hemoglobin: 8.6 g/dL — ABNORMAL LOW (ref 12.0–15.0)
MCH: 29.5 pg (ref 26.0–34.0)
MCHC: 33.7 g/dL (ref 30.0–36.0)
MCV: 87.3 fL (ref 78.0–100.0)
Platelets: 138 10*3/uL — ABNORMAL LOW (ref 150–400)
RBC: 2.92 MIL/uL — AB (ref 3.87–5.11)
RDW: 14.5 % (ref 11.5–15.5)
WBC: 17.2 10*3/uL — AB (ref 4.0–10.5)

## 2015-06-01 MED ORDER — MAGNESIUM OXIDE 400 (241.3 MG) MG PO TABS
400.0000 mg | ORAL_TABLET | Freq: Every day | ORAL | Status: DC
  Administered 2015-06-02: 400 mg via ORAL
  Filled 2015-06-01 (×2): qty 1

## 2015-06-01 MED ORDER — POLYSACCHARIDE IRON COMPLEX 150 MG PO CAPS
150.0000 mg | ORAL_CAPSULE | Freq: Every day | ORAL | Status: DC
  Administered 2015-06-02: 150 mg via ORAL
  Filled 2015-06-01: qty 1

## 2015-06-01 NOTE — Progress Notes (Signed)
POSTOPERATIVE DAY # 1 S/P CS  S:         Reports feeling well - tired             Tolerating po intake / no nausea / no vomiting / no flatus / no BM             Bleeding is light             Pain controlled with motrin             Up ad lib / ambulatory/ voiding QS  Newborn breast feeding  / Circumcision planned   O:  VS: BP 98/49 mmHg  Pulse 63  Temp(Src) 97.9 F (36.6 C) (Oral)  Resp 18  Ht  (1.676 m)  Wt 96.616 kg (213 lb)  BMI 34.40 kg/m2  SpO2 96%  Breastfeeding? Unknown   LABS:               Recent Labs  05/31/15 0755 06/01/15 0555  WBC 21.2* 17.2*  HGB 10.0* 8.6*  PLT 140* 138*               Bloodtype: --/--/A NEG (10/06 0853)  Rubella: Immune (03/08 0000)                                             I&O: Intake/Output      10/07 0701 - 10/08 0700 10/08 0701 - 10/09 0700   P.O. 740    I.V. (mL/kg) 631 (6.5)    Total Intake(mL/kg) 1371 (14.2)    Urine (mL/kg/hr) 1850 (0.8)    Blood     Total Output 1850     Net -479                       Physical Exam:             Alert and Oriented X3  Lungs: Clear and unlabored  Heart: regular rate and rhythm / no mumurs  Abdomen: soft, non-tender, mildly distended, hypoactive BS             Fundus: firm, non-tender, U-1             Dressing intact honeycomb              Incision:  approximated with suture / no erythema / no ecchymosis / no drainage  Perineum: intact  Lochia: light  Extremities: no edema, no calf pain or tenderness, negative Homans  A:        POD # 1 S/P CS            IDA of pregnancy compounded by ABL post-op  P:        Routine postoperative care              Advance activtity    Marlinda Mike CNM, MSN, [facility] 06/01/2015, 10:48 AM

## 2015-06-01 NOTE — Lactation Note (Signed)
This note was copied from the chart of Sarah Ramirez. Lactation Consultation Note  Follow up with mom, dad and baby. Infant was asleep in a visitors arms. Mom feels feedings are going well. Infant with 11 BF, 3 bottle feeds of 7-12 cc formula each, 5 voids and 2 stools in last 24 hours. Infant on single phototherapy thought to be secondary to cephalohematoma. Offered mom DEBP to stimulate milk production and to obtain BM for supplementation, mom declined saying she had a pump at home and can use it once she gets home. Dad reported that infant at times will take more formula, discussed supply and demand, stomach size and nutritional needs at 33 hours. Parents are aware that infant to receive formula after BF 8-12 x in 24 hours at volumes of 7-12 cc. Mom reports she has no questions/concerns at this time. Enc her to call with questions/concerns.  Patient Name: Sarah Ramirez [id number] Date: 06/01/2015 Reason for consult: Follow-up assessment   Maternal Data Formula Feeding for Exclusion: No  Feeding Length of feed: 20 min  LATCH Score/Interventions                      Lactation Tools Discussed/Used     Consult Status Consult Status: Follow-up Date: 06/02/15 Follow-up type: In-patient    Sarah Ramirez 06/01/2015, 3:35 PM

## 2015-06-02 MED ORDER — MAGNESIUM OXIDE 400 (241.3 MG) MG PO TABS: 400.0000 mg | ORAL_TABLET | Freq: Every day | ORAL | Status: AC

## 2015-06-02 MED ORDER — DOCUSATE SODIUM 100 MG PO CAPS: 100.0000 mg | ORAL_CAPSULE | Freq: Two times a day (BID) | ORAL | Status: AC

## 2015-06-02 MED ORDER — IBUPROFEN 600 MG PO TABS: 600.0000 mg | ORAL_TABLET | Freq: Four times a day (QID) | ORAL | Status: DC

## 2015-06-02 MED ORDER — POLYSACCHARIDE IRON COMPLEX 150 MG PO CAPS: 150.0000 mg | ORAL_CAPSULE | Freq: Every day | ORAL | Status: DC

## 2015-06-02 MED ORDER — POLYSACCHARIDE IRON COMPLEX 150 MG PO CAPS: 150.0000 mg | ORAL_CAPSULE | Freq: Every day | ORAL | Status: AC

## 2015-06-02 MED ORDER — OXYCODONE-ACETAMINOPHEN 5-325 MG PO TABS: 1.0000 | ORAL_TABLET | ORAL | Status: DC | PRN

## 2015-06-02 NOTE — Progress Notes (Signed)
POD# 2  S: Pt notes pain controlled w/ po meds, minimal lochia, nl void, out of bed w/o dizziness or chest pain, tol reg po, + flatus. Pt is breastfeeding. Baby  Well with little jaundice, pt would like d/c home early today.   Filed Vitals:   06/01/15 0418 06/01/15 0640 06/01/15 1900 06/02/15 0517  BP: 113/76 98/49 118/62 120/66  Pulse: 80 63 98 78  Temp: 98.2 F (36.8 C) 97.9 F (36.6 C) 98.5 F (36.9 C) 97.7 F (36.5 C)  TempSrc: Oral Oral Axillary Oral  Resp: Height:      Weight:      SpO2: 96%  99%     Gen: well appearing  Abd: soft, ND, approp tender, fundus below umbilicus, NT Inc: C/D/I, sutures LE: tr edema, NT  CBC    Component Value Date/Time   WBC 17.2* 06/01/2015 0555   RBC 2.92* 06/01/2015 0555   HGB 8.6* 06/01/2015 0555   HCT 25.5* 06/01/2015 0555   PLT 138* 06/01/2015 0555   MCV 87.3 06/01/2015 0555   MCH 29.5 06/01/2015 0555   MCHC 33.7 06/01/2015 0555   RDW 14.5 06/01/2015 0555    A/P: POD#  2 s/p c/s for arrest descent - post-op. Doing well.  - anemia, iron at d/c  Ziquan Fidel A. 06/02/2015 8:48 AM

## 2015-06-02 NOTE — Lactation Note (Signed)
This note was copied from the chart of Sarah Ramirez. Lactation Consultation Note  Patient Name: Sarah Inell Mimbs [id number] Date: 06/02/2015 Reason for consult: Follow-up assessment   Follow up with mom prior to D/C. Infant to be sent home today without phototherapy, has follow up appointment tomorrow morning to recheck bilirubin levels. Mom is happy with current plan of BF and supplementing, she will go home BF with formula supplementation. Infant with 13 BF for 10-30 minutes each, 11 bottle feedings of 7-19 cc formula post BF, 4 voids, 5 stools and 4 % weight loss. Infant is currently on single phototherapy.. Mom reports she has no nipple tenderness and her breast feel fuller today. Mom says she knows what to do for Engorgement and has a pump at home. Enc mom to call with questions/concerns.    Maternal Data Formula Feeding for Exclusion: No  Feeding Length of feed: 30 min  LATCH Score/Interventions                      Lactation Tools Discussed/Used     Consult Status Consult Status: Complete Date: 06/02/15 Follow-up type: Call as needed    Ed Blalock 06/02/2015, 11:57 AM

## 2015-06-02 NOTE — Discharge Summary (Signed)
POSTOPERATIVE DISCHARGE SUMMARY:  Patient ID: Sarah Ramirez MRN: [id number] DOB/AGE: 09-18-1985 29 y.o.  Admit date: 05/30/2015 Admission Diagnoses: 41 weeks / thrombocytopenia - gestational / mild  Discharge date: 06/02/2015  Discharge Diagnoses: POD 2 s/p CS / ABL anemia   Prenatal history: G1P1000   EDC : 05/24/2015, by Ultrasound  Prenatal care at [facility]  Primary provider : Ernestina Penna Prenatal course complicated by bipolar disorder - no meds / post-dates / mild gestational thrombocytopenia  Prenatal Labs: ABO, Rh: --/--/A NEG (10/06 4098) / Rhophylac not indicated - newborn RH negative Antibody: NEG (10/06 0853) Rubella: Immune (03/08 0000)   RPR: Non Reactive (10/06 0853)  HBsAg: Negative (03/08 0000)  HIV: Non-reactive (03/08 0000)  GTT : NL GBS: Negative (09/13 0000)   Medical / Surgical History :  Past medical history:  Past Medical History  Diagnosis Date  . Asthma   . Environmental allergies   . Bipolar disorder (HCC)   . Gastroenteritis 01/13/2015  . Vaginal Pap smear, abnormal   . Hx of varicella   . Thrombocytopenia complicating pregnancy [employer])     Past surgical history:  Past Surgical History  Procedure Laterality Date  . Sinus exploration    . Tonsillectomy    . Myringotomy with tube placement    . Gynecologic cryosurgery    . Wisdom tooth extraction      Family History:  Family History  Problem Relation Age of Onset  . Cancer Mother     skin  . Heart disease Mother   . Cancer Father     skin  . Hypertension Father   . Diabetes Maternal Grandmother   . Cancer Paternal Grandfather     skin  . Hypertension Paternal Grandfather     Social History:  reports that she has never smoked. She does not have any smokeless tobacco history on file. She reports that she does not drink alcohol or use illicit drugs.  Allergies: Peanut-containing drug products; Shellfish allergy; Tomato; Sulfa antibiotics; and Zoloft   Current  Medications at time of admission:  Prior to Admission medications   Medication Sig Start Date End Date Taking? Authorizing Provider  DiphenhydrAMINE HCl (BENADRYL PO) Take 0.5 tablets by mouth at bedtime as needed (sleep).   Yes Historical Provider, MD  Prenatal Vit-Fe Fumarate-FA (MULTIVITAMIN-PRENATAL) 27-0.8 MG TABS tablet Take 1 tablet by mouth daily at 12 noon.   Yes Historical Provider, MD  docusate sodium (COLACE) 100 MG capsule Take 1 capsule (100 mg total) by mouth 2 (two) times daily. 06/02/15   Noland Fordyce, MD    Intrapartum Course:  Admit for induction labor with labor progression to 8cm dilation with protracted labor curve Pain management: epidrual Complicated by: arrest of active labor Interventions required: cesarean section  Procedures: Cesarean section delivery on 05/31/2015 with delivery of viable female newborn by Dr Ernestina Penna   See operative report for further details APGAR (1 MIN): 9   APGAR (5 MINS): 9    Postoperative / postpartum course:  Uncomplicated with discharge on POD 2   Discharge Instructions:  Discharged Condition: stable  Activity: pelvic rest and postoperative restrictions x 2   Diet: routine  Medications:    Medication List    STOP taking these medications        BENADRYL PO      TAKE these medications        docusate sodium 100 MG capsule  Commonly known as:  COLACE  Take 1 capsule (100 mg total) by  mouth 2 (two) times daily.     ibuprofen 600 MG tablet  Commonly known as:  ADVIL,MOTRIN  Take 1 tablet (600 mg total) by mouth every 6 (six) hours.     iron polysaccharides 150 MG capsule  Commonly known as:  NIFEREX  Take 1 capsule (150 mg total) by mouth daily.     magnesium oxide 400 (241.3 MG) MG tablet  Commonly known as:  MAG-OX  Take 1 tablet (400 mg total) by mouth daily.     multivitamin-prenatal 27-0.8 MG Tabs tablet  Take 1 tablet by mouth daily at 12 noon.     oxyCODONE-acetaminophen 5-325 MG tablet  Commonly  known as:  PERCOCET/ROXICET  Take 1 tablet by mouth every 4 (four) hours as needed (for pain scale 4-7).        Wound Care: keep clean and dry / remove honeycomb POD 4 Postpartum Instructions: [facility] discharge booklet - instructions reviewed  Discharge to: Home  Follow up :   [facility] in 6 weeks for routine postpartum visit with Dr Ernestina Penna                Signed: Marlinda Mike CNM, MSN, [facility] 06/02/2015, 9:19 AM

## 2015-06-03 ENCOUNTER — Encounter (HOSPITAL_COMMUNITY): Payer: Self-pay | Admitting: Obstetrics

## 2015-06-03 LAB — BIRTH TISSUE RECOVERY COLLECTION (PLACENTA DONATION)

## 2015-06-06 ENCOUNTER — Encounter (HOSPITAL_COMMUNITY): Payer: Self-pay

## 2015-06-07 ENCOUNTER — Encounter (HOSPITAL_COMMUNITY): Payer: Self-pay | Admitting: Obstetrics

## 2017-12-04 ENCOUNTER — Emergency Department (INDEPENDENT_AMBULATORY_CARE_PROVIDER_SITE_OTHER): Payer: 59

## 2017-12-04 ENCOUNTER — Emergency Department
Admission: EM | Admit: 2017-12-04 | Discharge: 2017-12-04 | Disposition: A | Discharge status: Home / Self Care | Payer: 59 | Source: Home / Self Care | Attending: Emergency Medicine | Admitting: Emergency Medicine

## 2017-12-04 ENCOUNTER — Other Ambulatory Visit: Payer: Self-pay

## 2017-12-04 DIAGNOSIS — R110 Nausea: Secondary | ICD-10-CM

## 2017-12-04 DIAGNOSIS — R1032 Left lower quadrant pain: Secondary | ICD-10-CM | POA: Diagnosis not present

## 2017-12-04 DIAGNOSIS — N946 Dysmenorrhea, unspecified: Secondary | ICD-10-CM | POA: Diagnosis not present

## 2017-12-04 LAB — POCT CBC W AUTO DIFF (K'VILLE URGENT CARE)

## 2017-12-04 LAB — POCT URINALYSIS DIP (MANUAL ENTRY)
Bilirubin, UA: NEGATIVE
Glucose, UA: NEGATIVE mg/dL
Ketones, POC UA: NEGATIVE mg/dL
Leukocytes, UA: NEGATIVE
Nitrite, UA: NEGATIVE
Protein Ur, POC: NEGATIVE mg/dL
Spec Grav, UA: 1.015 (ref 1.010–1.025)
Urobilinogen, UA: 0.2 E.U./dL
pH, UA: 7 (ref 5.0–8.0)

## 2017-12-04 LAB — POCT URINE PREGNANCY: Preg Test, Ur: NEGATIVE

## 2017-12-04 MED ORDER — ONDANSETRON 4 MG PO TBDP
4.0000 mg | ORAL_TABLET | ORAL | Status: AC
Start: 2017-12-04 — End: 2017-12-04
  Administered 2017-12-04: 4 mg via ORAL

## 2017-12-04 MED ORDER — MELOXICAM 7.5 MG PO TABS: ORAL_TABLET | ORAL | 0 refills | Status: AC

## 2017-12-04 MED ORDER — ONDANSETRON 4 MG PO TBDP: 4.0000 mg | ORAL_TABLET | Freq: Three times a day (TID) | ORAL | 0 refills | Status: AC | PRN

## 2017-12-04 NOTE — Discharge Instructions (Signed)
Red blood cell count and white blood cell count are normal.  With normal white blood cell count, it is unlikely that you have an infection.  Urine test is normal except some blood, consistent with you being on menses.  Urine pregnancy test negative. Abdominal x-rays are normal, with IUD in place. Your pain is likely related to menstruation.-Please see attached information sheet on dysmenorrhea. I am prescribing Mobic twice a day with food for pain and Zofran as needed for nausea. (Here in urgent care, we gave you Zofran by mouth for nausea) Rest and push fluids.  May try heating pad. It is important to follow-up with your PCP and OB/GYN within 7 days.-If any severe or worsening symptoms, go to emergency room.

## 2017-12-04 NOTE — ED Triage Notes (Signed)
Pt c/o of recurrent abdominal pain x 1 year. Not sure if related to menstrual cycle. Saw GI and they did not do any imaging. Has ParaGard. Hopes still in place.

## 2017-12-04 NOTE — ED Provider Notes (Signed)
[facility]    CSN: [id number] Arrival date & time: 12/04/17  1300     History   Chief Complaint Chief Complaint  Patient presents with  . Abdominal Pain    HPI Sarah Ramirez Begin is a 32 y.o. female.   The history is provided by the patient.  Abdominal Pain  Pain location:  LLQ Pain quality: sharp   Pain radiates to:  Suprapubic region Pain severity now: Moderate to severe.  She states 6 or 7 out of 10. Onset quality: Onset last night. Timing:  Constant Progression:  Unchanged Chronicity:  Recurrent (About once a month for the past year) Context: recent travel (Long car ride, but no foreign travel)   Context: not alcohol use, not eating, not recent illness, not recent sexual activity, not sick contacts and not trauma   Relieved by:  Nothing Worsened by:  Palpation Ineffective treatments:  None tried Associated symptoms: chills (Once earlier this morning), constipation (Chronic.-Last BM yesterday was formed, without blood or mucus), fatigue, nausea and vaginal bleeding (On menses)   Associated symptoms: no chest pain, no cough, no diarrhea, no dysuria, no fever (Has not taken her temperature, but she states she felt warm), no hematemesis, no hematochezia, no hematuria, no melena, no shortness of breath, no sore throat, no vaginal discharge and no vomiting    Pt c/o of recurrent abdominal pain x 1 year.  She feels it might be related to her menstrual cycle, but she is not sure.  She has regular monthly menstrual periods, and started her menstrual period yesterday at the right time in her cycle, last period before that was about 28 days prior.    Has ParaGard IUD.  [facility], Monogamous with husband, denies vaginal discharge.  She states it is not possible she could have an STD and she declines any testing for STDs.  She declines pelvic exam today.  Has long-standing chronic constipation, has been evaluated by GI in the past with negative upper  and lower endoscopy.  She states she was placed on Linzess in the past for chronic constipation, but that was not effective she states, and she DC'd Linzess last year on her own.   Past Medical History:  Diagnosis Date  . Asthma   . Bipolar disorder (HCC)   . Environmental allergies   . Gastroenteritis 01/13/2015  . Hx of varicella   . Thrombocytopenia complicating pregnancy (HCC)   . Vaginal Pap smear, abnormal   Past medical history: Asthma in the past, not active now. Bipolar disorder  Patient Active Problem List   Diagnosis Date Noted  . Postpartum care following cesarean delivery (10/7) 05/31/2015  . Post-dates pregnancy, delivered, current hospitalization 05/31/2015  . Normal labor 05/30/2015  . Gastroenteritis 01/13/2015    Past Surgical History:  Procedure Laterality Date  . CESAREAN SECTION N/A 05/31/2015   Procedure: CESAREAN SECTION;  Surgeon: Noland FordyceKelly Fogleman, MD;  Location: [facility];  Service: Obstetrics;  Laterality: N/A;  . GYNECOLOGIC CRYOSURGERY    . MYRINGOTOMY WITH TUBE PLACEMENT    . SINUS EXPLORATION    . TONSILLECTOMY    . WISDOM TOOTH EXTRACTION     Past surgeries: C-section 2016, gynecologic cryosurgery in the past, tonsillectomy in the past. OB History    Gravida  1   Para  1   Term  1   Preterm      AB      Living  1     SAB  TAB      Ectopic      Multiple  0   Live Births  1            Home Medications    Prior to Admission medications   Medication Sig Start Date End Date Taking? Authorizing Provider  docusate sodium (COLACE) 100 MG capsule Take 1 capsule (100 mg total) by mouth 2 (two) times daily. 06/02/15   Noland Fordyce, MD  iron polysaccharides (NIFEREX) 150 MG capsule Take 1 capsule (150 mg total) by mouth daily. 06/02/15   Marlinda Mike, CNM  magnesium oxide (MAG-OX) 400 (241.3 MG) MG tablet Take 1 tablet (400 mg total) by mouth daily. 06/02/15   Marlinda Mike, CNM  meloxicam (MOBIC) 7.5 MG tablet Take 1 twice a day  as needed for pain. Take with food. (Do not take with any other NSAID.) 12/04/17   Lajean Manes, MD  ondansetron (ZOFRAN-ODT) 4 MG disintegrating tablet Take 1 tablet (4 mg total) by mouth every 8 (eight) hours as needed for nausea or vomiting. 12/04/17   Lajean Manes, MD  Prenatal Vit-Fe Fumarate-FA (MULTIVITAMIN-PRENATAL) 27-0.8 MG TABS tablet Take 1 tablet by mouth daily at 12 noon.    [provider]    Family History Family History  Problem Relation Age of Onset  . Cancer Mother        skin  . Heart disease Mother   . Cancer Father        skin  . Hypertension Father   . Diabetes Maternal Grandmother   . Cancer Paternal Grandfather        skin  . Hypertension Paternal Grandfather   Family history positive for heart disease, hypertension, skin cancer  Social History Social History   Tobacco Use  . Smoking status: Never Smoker  . Smokeless tobacco: Never Used  Substance Use Topics  . Alcohol use: No  . Drug use: No   Does not use tobacco.  Denies illegal drugs.  Drinks 3 alcoholic beverages a week, none recently.  Allergies   Peanut-containing drug products; Shellfish allergy; Tomato; Sulfa antibiotics; and Zoloft [sertraline hcl]   Review of Systems Review of Systems  Constitutional: Positive for chills (Once earlier this morning) and fatigue. Negative for fever (Has not taken her temperature, but she states she felt warm).  HENT: Negative for congestion and sore throat.   Respiratory: Negative for cough and shortness of breath.   Cardiovascular: Negative for chest pain, palpitations and leg swelling.  Gastrointestinal: Positive for abdominal pain, constipation (Chronic.-Last BM yesterday was formed, without blood or mucus) and nausea. Negative for abdominal distention, blood in stool, diarrhea, hematemesis, hematochezia, melena and vomiting.  Genitourinary: Positive for vaginal bleeding (On menses). Negative for dysuria, hematuria, vaginal discharge and vaginal  pain.  Musculoskeletal: Negative for neck pain.       No back pain  Neurological: Negative for seizures, syncope and light-headedness.  Hematological: Negative for adenopathy.  Psychiatric/Behavioral: Negative for confusion.  All other systems reviewed and are negative.    Physical Exam Triage Vital Signs ED Triage Vitals  Enc Vitals Group     BP 12/04/17 1333 122/79     Pulse Rate 12/04/17 1333 74     Resp 12/04/17 1333 18     Temp 12/04/17 1333 98.3 F (36.8 C)     Temp Source 12/04/17 1333 Oral     SpO2 12/04/17 1333 100 %     Weight 12/04/17 1334 170 lb (77.1 kg)     Height  12/04/17 1334 5\' 7"  (1.702 m)     Head Circumference --      Peak Flow --      Pain Score 12/04/17 1333 0     Pain Loc --      Pain Edu? --      Excl. in GC? --    No data found.  Updated Vital Signs BP 122/79 (BP Location: Right Arm)   Pulse 74   Temp 98.3 F (36.8 C) (Oral)   Resp 18   Ht 5\' 7"  (1.702 m)   Wt 170 lb (77.1 kg)   LMP 12/02/2017 (Approximate) Comment: Currently menstruating  SpO2 100%   BMI 26.63 kg/m    Physical Exam  Constitutional: She is oriented to person, place, and time. She appears well-developed and well-nourished. No distress.  HENT:  Head: Normocephalic.  Mouth/Throat: Oropharynx is clear and moist.  Eyes: Pupils are equal, round, and reactive to light. No scleral icterus.  Cardiovascular: Normal rate and normal heart sounds.  Pulmonary/Chest: Effort normal and breath sounds normal.  Abdominal: Bowel sounds are normal. She exhibits no shifting dullness, no distension, no abdominal bruit, no pulsatile midline mass and no mass. There is no hepatosplenomegaly. There is tenderness in the left lower quadrant. There is no rigidity, no rebound, no guarding, no CVA tenderness, no tenderness at McBurney's point and negative Murphy's sign. No hernia.  Neurological: She is alert and oriented to person, place, and time.  Skin: Skin is warm and dry. Capillary refill takes  less than 2 seconds. No rash noted. She is not diaphoretic.  Psychiatric: She has a normal mood and affect. Her behavior is normal.  Nursing note and vitals reviewed.    UC Treatments / Results  Labs (all labs ordered are listed, but only abnormal results are displayed) Labs Reviewed  POCT URINALYSIS DIP (MANUAL ENTRY) - Abnormal; Notable for the following components:      Result Value   Blood, UA moderate (*)    All other components within normal limits  URINE CULTURE  POCT CBC W AUTO DIFF ([facility])  POCT URINE PREGNANCY   Discussed workup options. She agrees with the following in-house lab tests: Urine pregnancy test negative. UA normal except moderate blood likely consistent with being on menses. CBC within normal limits.  WBC 9.1, normal differential.  Hemoglobin 13.1.  She specifically requests plain abdominal x-rays as part of her workup, and I explained limitations of that type of imaging and she understands that we do not have CT or abdominal ultrasound at our facility at this time.  Therefore, 2 view abdominal x-rays ordered.  EKG None Radiology Dg Abd 2 Views  Result Date: 12/04/2017 CLINICAL DATA:  Pt c/o of recurrent LLQ abdominal pain x 1 year. Pain became worse last night with nausea. EXAM: ABDOMEN - 2 VIEW COMPARISON:  None. FINDINGS: Bowel gas pattern is nonobstructive. No evidence of soft tissue mass or abnormal fluid collection. No evidence of free intraperitoneal air. No evidence of renal or ureteral calculi. Intrauterine device appears appropriately positioned in the midline pelvis. Osseous structures appear normal. IMPRESSION: Negative. Electronically Signed   By: Bary Richard M.D.   On: 12/04/2017 14:31    Procedures Procedures (including critical care time)  Medications Ordered in UC Medications  ondansetron (ZOFRAN-ODT) disintegrating tablet 4 mg (has no administration in time range)     Initial Impression / Assessment and Plan / UC  Course  I have reviewed the triage vital signs and the nursing  notes.  Pertinent labs & imaging results that were available during my care of the patient were reviewed by me and considered in my medical decision making (see chart for details).    We reviewed all the above lab and imaging tests which were negative. No evidence of bacterial infection.  Very unlikely that this could be diverticulitis, as no documented fever and white blood cell count normal, and history history of monthly menstrual pain and other history and today's physical exam is consistent with menstrual and/or ovarian pain.  IUD in place on plain x-ray today.  Plans:  Treatment options discussed, as well as risks, benefits, alternatives. Patient voiced understanding and agreement with the following plans:  An After Visit Summary was printed and given to the patient.: "Red blood cell count and white blood cell count are normal.  With normal white blood cell count, it is unlikely that you have an infection.  Urine test is normal except some blood, consistent with you being on menses.  Urine pregnancy test negative. Abdominal x-rays are normal, with IUD in place. Your pain is likely related to menstruation.-Please see attached information sheet on dysmenorrhea. I am prescribing Mobic twice a day with food for pain and Zofran as needed for nausea. (Here in urgent care, we gave you Zofran by mouth for nausea) Rest and push fluids.  May try heating pad. It is important to follow-up with your PCP and OB/GYN within 7 days.-If any severe or worsening symptoms, go to emergency room."  Also, Zofran 4 mg SL given here in urgent care, and she was rechecked and nausea resolved and abdominal pain decreased to 3/10 in intensity.  Precautions discussed. Red flags discussed. Questions invited and answered. Patient voiced understanding and agreement.   Final Clinical Impressions(s) / UC Diagnoses   Final diagnoses:  Acute left lower  quadrant pain  Dysmenorrhea     ED Discharge Orders        Ordered    ondansetron (ZOFRAN-ODT) 4 MG disintegrating tablet  Every 8 hours PRN     12/04/17 1459    meloxicam (MOBIC) 7.5 MG tablet     12/04/17 1459     She declined any stronger pain medicine.  She states plain Tylenol never works, so she is not going to take regular Tylenol.  Ibuprofen or Aleve has helped somewhat in the past, and after risk benefits alternatives, prescribed Mobic 7.5 mg twice daily.  #20, no refills.  Advise: Do not take any other NSAIDs with this.  Controlled Substance Prescriptions [place] Controlled Substance Registry consulted? Not Applicable   Lajean Manes, MD 12/04/17 1525

## 2017-12-05 LAB — URINE CULTURE
MICRO NUMBER:: 90458583
SPECIMEN QUALITY:: ADEQUATE

## 2017-12-06 ENCOUNTER — Telehealth: Payer: Self-pay | Admitting: Emergency Medicine

## 2019-04-24 IMAGING — DX DG ABDOMEN 2V
2 series · 2 of 2 positions shown · non-contrast
Comparison: None.

CLINICAL DATA: Pt c/o of recurrent LLQ abdominal pain x 1 year.
Pain became worse last night with nausea.

EXAM:
ABDOMEN - 2 VIEW

[abdomen erect]
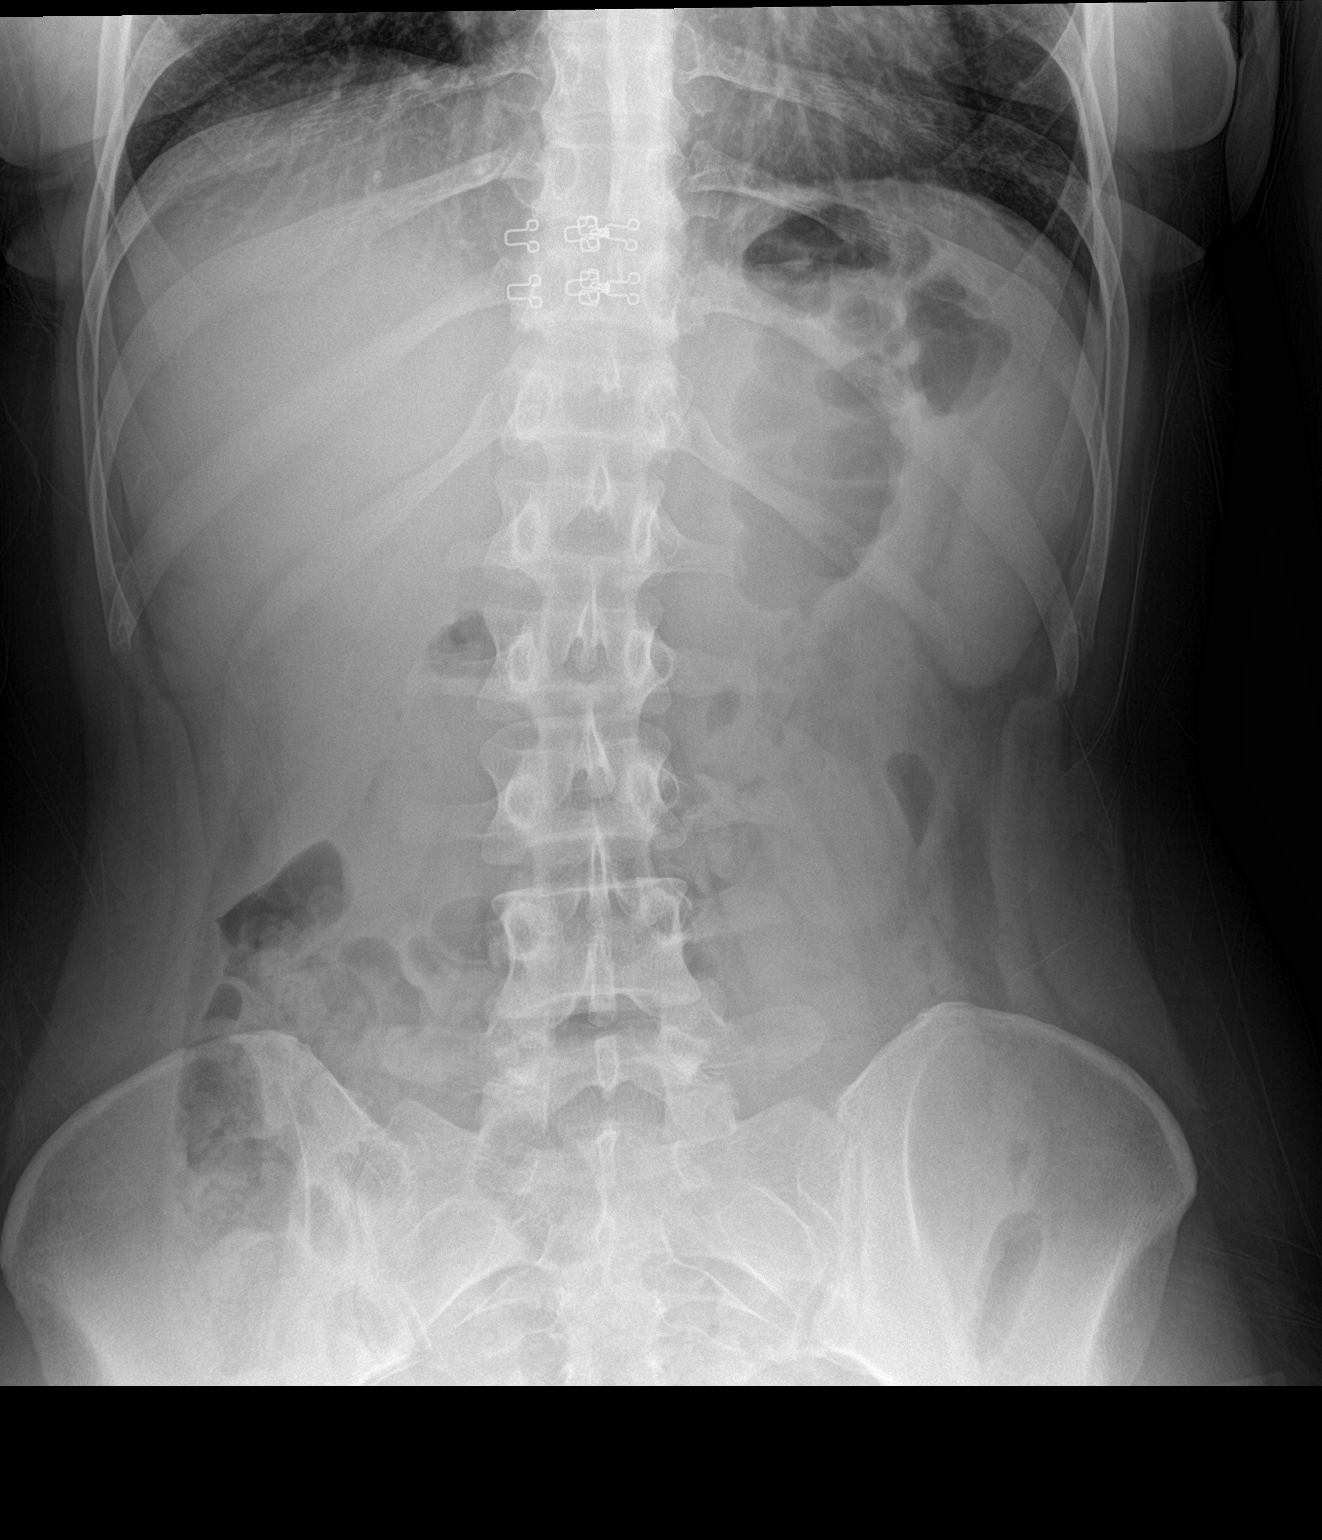

[abdomen supine]
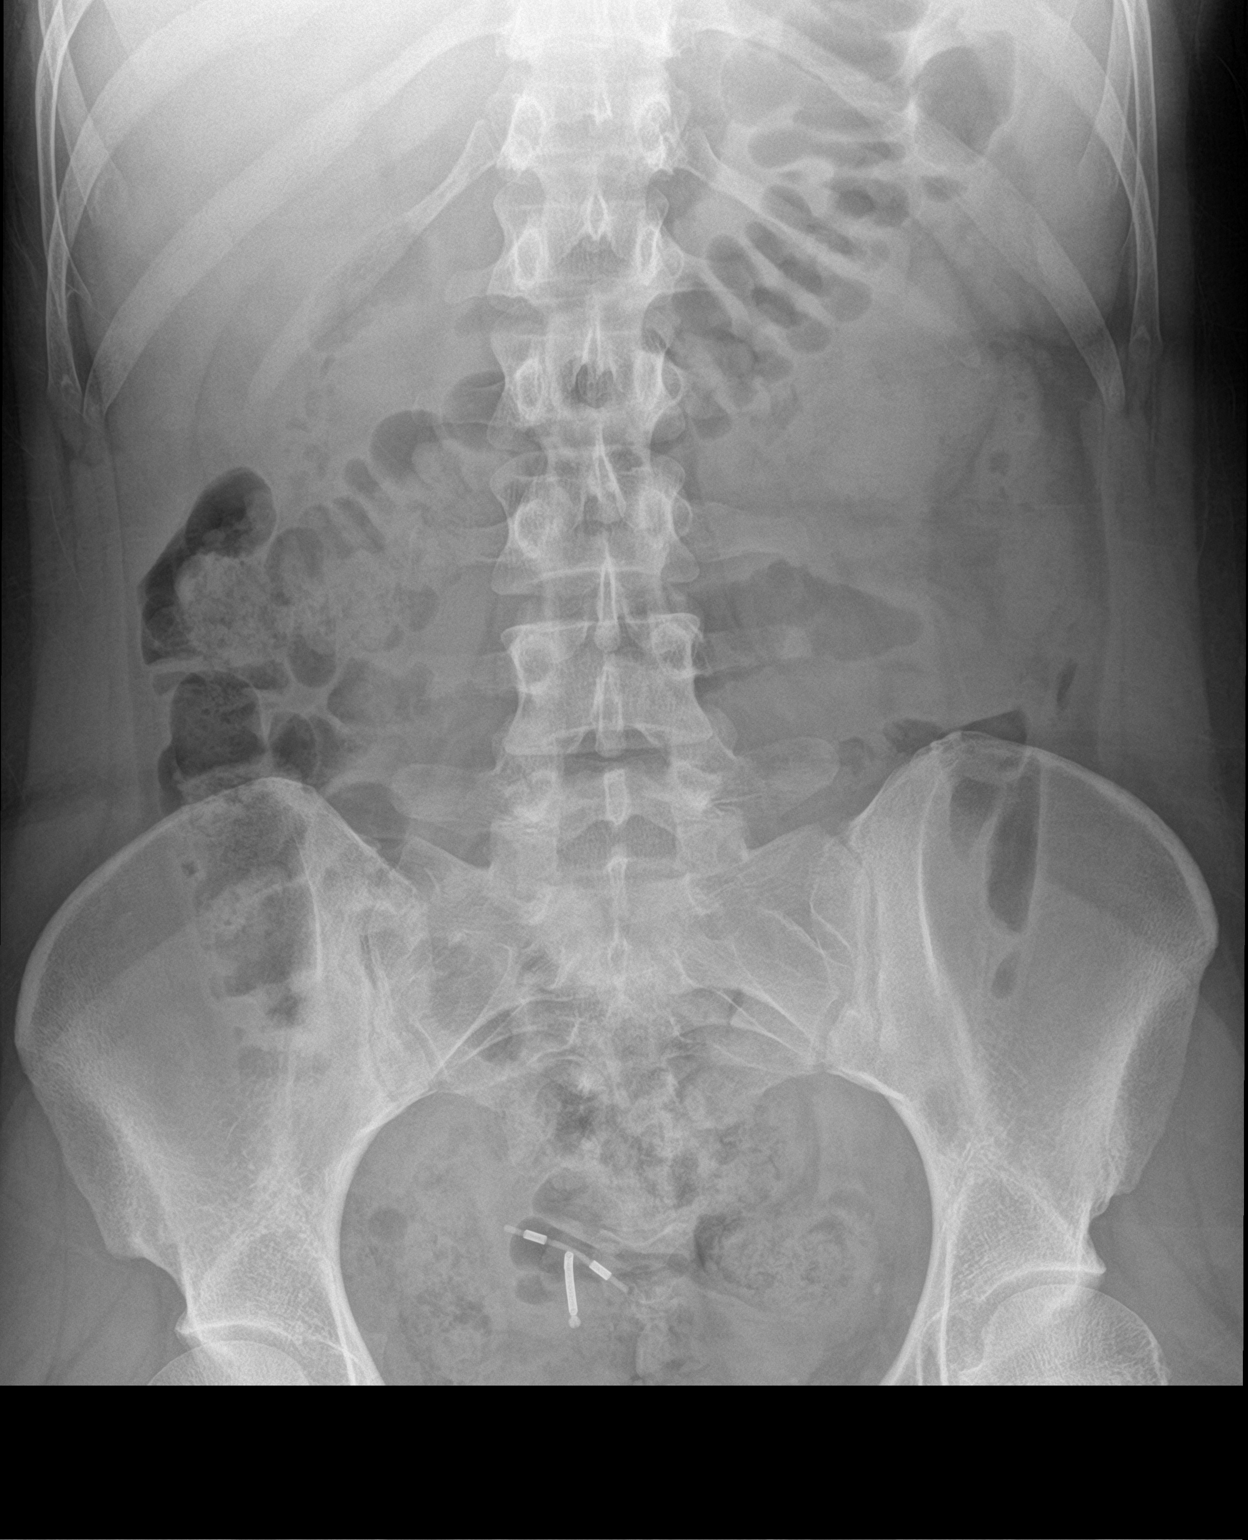

[2 of 2 positions shown; findings below may reference images not displayed]

FINDINGS: Bowel gas pattern is nonobstructive. No evidence of soft tissue mass
or abnormal fluid collection. No evidence of free intraperitoneal
air. No evidence of renal or ureteral calculi. Intrauterine device
appears appropriately positioned in the midline pelvis. Osseous
structures appear normal.
IMPRESSION: Negative.

## 2021-09-18 ENCOUNTER — Emergency Department (HOSPITAL_BASED_OUTPATIENT_CLINIC_OR_DEPARTMENT_OTHER): Payer: 59 | Admitting: Radiology

## 2021-09-18 ENCOUNTER — Emergency Department (HOSPITAL_BASED_OUTPATIENT_CLINIC_OR_DEPARTMENT_OTHER)
Admission: EM | Admit: 2021-09-18 | Discharge: 2021-09-18 | Disposition: A | Discharge status: Home / Self Care | Payer: 59 | Attending: Emergency Medicine | Admitting: Emergency Medicine

## 2021-09-18 ENCOUNTER — Other Ambulatory Visit: Payer: Self-pay

## 2021-09-18 ENCOUNTER — Encounter (HOSPITAL_BASED_OUTPATIENT_CLINIC_OR_DEPARTMENT_OTHER): Payer: Self-pay | Admitting: Emergency Medicine

## 2021-09-18 DIAGNOSIS — E119 Type 2 diabetes mellitus without complications: Secondary | ICD-10-CM | POA: Diagnosis not present

## 2021-09-18 DIAGNOSIS — R202 Paresthesia of skin: Secondary | ICD-10-CM | POA: Insufficient documentation

## 2021-09-18 DIAGNOSIS — J45909 Unspecified asthma, uncomplicated: Secondary | ICD-10-CM | POA: Insufficient documentation

## 2021-09-18 DIAGNOSIS — R110 Nausea: Secondary | ICD-10-CM | POA: Diagnosis not present

## 2021-09-18 DIAGNOSIS — I10 Essential (primary) hypertension: Secondary | ICD-10-CM | POA: Insufficient documentation

## 2021-09-18 DIAGNOSIS — Z91010 Allergy to peanuts: Secondary | ICD-10-CM | POA: Diagnosis not present

## 2021-09-18 DIAGNOSIS — R7989 Other specified abnormal findings of blood chemistry: Secondary | ICD-10-CM | POA: Diagnosis not present

## 2021-09-18 DIAGNOSIS — R079 Chest pain, unspecified: Secondary | ICD-10-CM | POA: Diagnosis present

## 2021-09-18 LAB — CBC
HCT: 41 % (ref 36.0–46.0)
Hemoglobin: 13.7 g/dL (ref 12.0–15.0)
MCH: 29.8 pg (ref 26.0–34.0)
MCHC: 33.4 g/dL (ref 30.0–36.0)
MCV: 89.3 fL (ref 80.0–100.0)
Platelets: 208 10*3/uL (ref 150–400)
RBC: 4.59 MIL/uL (ref 3.87–5.11)
RDW: 12.6 % (ref 11.5–15.5)
WBC: 10.4 10*3/uL (ref 4.0–10.5)
nRBC: 0 % (ref 0.0–0.2)

## 2021-09-18 LAB — BASIC METABOLIC PANEL
Anion gap: 9 (ref 5–15)
BUN: 15 mg/dL (ref 6–20)
CO2: 23 mmol/L (ref 22–32)
Calcium: 9.3 mg/dL (ref 8.9–10.3)
Chloride: 106 mmol/L (ref 98–111)
Creatinine, Ser: 0.88 mg/dL (ref 0.44–1.00)
GFR, Estimated: >60 mL/min (ref >60)
Glucose, Bld: 103 mg/dL — ABNORMAL HIGH (ref 70–99)
Potassium: 3.7 mmol/L (ref 3.5–5.1)
Sodium: 138 mmol/L (ref 135–145)

## 2021-09-18 LAB — TROPONIN I (HIGH SENSITIVITY): Troponin I (High Sensitivity): <2 ng/L (ref <18)

## 2021-09-18 MED ORDER — KETOROLAC TROMETHAMINE 30 MG/ML IJ SOLN
30.0000 mg | Freq: Once | INTRAMUSCULAR | Status: DC
  Filled 2021-09-18: qty 1

## 2021-09-18 NOTE — ED Triage Notes (Signed)
Pt arrives to ED with c/o chest pain. This started x3 days ago. She reports this is intermittent last x1 hour. Pt describes the pain as pressure. The CP is left sided and does not radiate. Associated symptoms include right sided tingling in fingers, chills. No associated SOB.

## 2021-09-18 NOTE — ED Provider Notes (Signed)
[facility] EMERGENCY DEPT Provider Note   CSN: [id number] Arrival date & time: 09/18/21  1642     History  Chief Complaint  Patient presents with   Chest Pain    Sarah Ramirez is a 36 y.o. female.   Chest Pain  Patient presents with chest pain.  Started 3 days ago, was intermittent but has been constant today.  Initially was associated with nausea, has not been since then.  Pain is left-sided and substernal, does not radiate elsewhere.  Occasionally has associated right tingling in her fingers with chills but no shortness of breath or fevers at home.  The pain at most before today lasted an hour.  Felt like pressure, sometimes feels sharp..  Vapes but does not smoke cigarettes.  Occasionally drinks alcohol.  Patient does not take any medicine for hypertension, diabetes.  Family history of CAD before 79, patient does not have history of previous STEMI's or PEs.  PMH: asthma, allergies, GERD, bipolar disorder  Home Medications Prior to Admission medications   Medication Sig Start Date End Date Taking? Authorizing Provider  docusate sodium (COLACE) 100 MG capsule Take 1 capsule (100 mg total) by mouth 2 (two) times daily. 06/02/15   Noland Fordyce, MD  iron polysaccharides (NIFEREX) 150 MG capsule Take 1 capsule (150 mg total) by mouth daily. 06/02/15   Marlinda Mike, CNM  magnesium oxide (MAG-OX) 400 (241.3 MG) MG tablet Take 1 tablet (400 mg total) by mouth daily. 06/02/15   Marlinda Mike, CNM  meloxicam (MOBIC) 7.5 MG tablet Take 1 twice a day as needed for pain. Take with food. (Do not take with any other NSAID.) 12/04/17   Lajean Manes, MD  ondansetron (ZOFRAN-ODT) 4 MG disintegrating tablet Take 1 tablet (4 mg total) by mouth every 8 (eight) hours as needed for nausea or vomiting. 12/04/17   Lajean Manes, MD  Prenatal Vit-Fe Fumarate-FA (MULTIVITAMIN-PRENATAL) 27-0.8 MG TABS tablet Take 1 tablet by mouth daily at 12 noon.    [provider]       Allergies    Peanut-containing drug products, Shellfish allergy, Tomato, Sertraline, Sulfa antibiotics, and Zoloft [sertraline hcl]    Review of Systems   Review of Systems  Cardiovascular:  Positive for chest pain.   Physical Exam Updated Vital Signs BP 127/83    Pulse 78    Temp 97.8 F (36.6 C)    Resp 18    Ht 5\' 7"  (1.702 m)    Wt 77.1 kg    LMP 09/04/2021    SpO2 100%    BMI 26.63 kg/m  Physical Exam Vitals and nursing note reviewed.  Constitutional:      General: She is not in acute distress.    Appearance: She is well-developed.  HENT:     Head: Normocephalic and atraumatic.  Eyes:     Extraocular Movements: Extraocular movements intact.     Conjunctiva/sclera: Conjunctivae normal.     Pupils: Pupils are equal, round, and reactive to light.  Cardiovascular:     Rate and Rhythm: Normal rate and regular rhythm.     Heart sounds: No murmur heard. Pulmonary:     Effort: Pulmonary effort is normal. No respiratory distress.     Breath sounds: Normal breath sounds.  Chest:     Chest wall: No tenderness.  Abdominal:     Palpations: Abdomen is soft.     Tenderness: There is no abdominal tenderness.  Musculoskeletal:        General: No swelling.  Cervical back: Neck supple.     Right lower leg: No edema.     Left lower leg: No edema.  Skin:    General: Skin is warm and dry.     Capillary Refill: Capillary refill takes less than 2 seconds.  Neurological:     Mental Status: She is alert.  Psychiatric:        Mood and Affect: Mood normal.    ED Results / Procedures / Treatments   Labs (all labs ordered are listed, but only abnormal results are displayed) Labs Reviewed  BASIC METABOLIC PANEL - Abnormal; Notable for the following components:      Result Value   Glucose, Bld 103 (*)    All other components within normal limits  CBC  TROPONIN I (HIGH SENSITIVITY)    EKG EKG Interpretation  Date/Time:  Thursday September 18 2021 16:50:56 EST Ventricular  Rate:  93 PR Interval:  124 QRS Duration: 92 QT Interval:  354 QTC Calculation: 440 R Axis:   79 Text Interpretation: Normal sinus rhythm Normal ECG No previous ECGs available Confirmed by Gareth Morgan [phone]) on 09/18/2021 6:31:05 PM  Radiology DG Chest 2 View  Result Date: 09/18/2021 CLINICAL DATA:  Chest pain and right hand numbness. EXAM: CHEST - 2 VIEW COMPARISON:  None. FINDINGS: Heart size is normal. Mediastinal shadows are normal. The lungs are clear. No bronchial thickening. No infiltrate, mass, effusion or collapse. Pulmonary vascularity is normal. No bony abnormality. IMPRESSION: Normal chest Electronically Signed   By: Nelson Chimes M.D.   On: 09/18/2021 17:37    Procedures Procedures    Medications Ordered in ED Medications - No data to display  ED Course/ Medical Decision Making/ A&P                           Medical Decision Making Amount and/or Complexity of Data Reviewed Labs: ordered. Radiology: ordered.  Risk Prescription drug management.   This patient presents to the ED for concern of chest pain, this involves an extensive number of treatment options, and is a complaint that carries with it a high risk of complications and morbidity.  The differential diagnosis includes ACS, PE, pneumonia, PTX, dissection, GERD, esophageal rupture, thyroid disease, arrhythmia, other   Co morbidities that complicate the patient evaluation: N/A   Additional history obtained: -Additional history obtained from spouse -External records from outside source obtained and reviewed including: Chart review including previous notes, labs, imaging, consultation notes   Lab Tests: -I ordered, reviewed, and interpreted labs.  The pertinent results include: Low troponin.  No leukocytosis or anemia.  No gross electrolyte derangement.   EKG -Normal sinus rhythm without any ischemic findings, not tachycardic cardiac   Imaging Studies ordered: -I ordered imaging studies  including chest x-ray -I independently visualized and interpreted imaging which showed no acute pulmonary pathology -I agree with the radiologist interpretation   Medicines ordered and prescription drug management: -Patient offered medicine and declined. -I have reviewed the patients home medicines and have made adjustments as needed   ED Course: Patient heart score 2.  Vitals are stable, PERC negative.  Given negative troponin and no ischemic findings on EKG I do not suspect ACS.  Not consistent with esophageal rupture or dissection.  Doubt pericarditis, myocarditis, pneumothorax.  Physical exam benign, vital stable.  I considered doing additional work-up given the findings and stable vitals and intensity of symptoms do not feel emergent work-up is necessary at this time.  Cardiac Monitoring: The patient was maintained on a cardiac monitor.  I personally viewed and interpreted the cardiac monitored which showed an underlying rhythm of: NSR   Reevaluation: After the interventions noted above, I reevaluated the patient and found that they have :improved   Dispostion: Follow-up with your PCP.  Discharged in stable condition.         Final Clinical Impression(s) / ED Diagnoses Final diagnoses:  Chest pain, unspecified type    Rx / DC Orders ED Discharge Orders     None         Sherrill Raring, Hershal Coria 09/18/21 2106    Gareth Morgan, MD 09/20/21 1243

## 2021-09-18 NOTE — Discharge Instructions (Addendum)
Establish care with a primary care doctor for further evaluation and work-up.  Based on today's evaluation I do not see any emergent etiology for your chest pain.  Continue taking Tylenol and anti-inflammatory medicine as needed to help alleviate the pain.  Keep a diary of when you had the symptoms, time including time, associated symptoms, character of the symptom, what you are doing when it starts.  This can hopefully help discover a pattern.  Return if things change or worsen significantly.
